# Patient Record
Sex: Male | Born: 1955 | Race: White | Hispanic: No | Marital: Married | Smoking: Former smoker
Health system: Southern US, Community
[De-identification: ages and names within clinical notes are randomized; demographics above are authoritative.]

## PROBLEM LIST (undated history)

## (undated) DIAGNOSIS — G4730 Sleep apnea, unspecified: Secondary | ICD-10-CM

## (undated) DIAGNOSIS — N529 Male erectile dysfunction, unspecified: Secondary | ICD-10-CM

## (undated) DIAGNOSIS — C61 Malignant neoplasm of prostate: Secondary | ICD-10-CM

## (undated) DIAGNOSIS — C801 Malignant (primary) neoplasm, unspecified: Secondary | ICD-10-CM

## (undated) DIAGNOSIS — Z85528 Personal history of other malignant neoplasm of kidney: Secondary | ICD-10-CM

## (undated) DIAGNOSIS — E119 Type 2 diabetes mellitus without complications: Secondary | ICD-10-CM

## (undated) DIAGNOSIS — E785 Hyperlipidemia, unspecified: Secondary | ICD-10-CM

## (undated) HISTORY — PX: BACK SURGERY: SHX140

## (undated) HISTORY — PX: NEPHRECTOMY: SHX65

## (undated) HISTORY — PX: APPENDECTOMY: SHX54

## (undated) HISTORY — PX: TONSILLECTOMY: SUR1361

## (undated) HISTORY — PX: COLONOSCOPY: SHX174

## (undated) HISTORY — PX: PROSTATE SURGERY: SHX751

## (undated) HISTORY — PX: VASECTOMY: SHX75

## (undated) HISTORY — PX: HERNIA REPAIR: SHX51

## (undated) HISTORY — PX: KIDNEY SURGERY: SHX687

## (undated) HISTORY — PX: INNER EAR SURGERY: SHX679

## (undated) HISTORY — PX: ANKLE SURGERY: SHX546

## (undated) HISTORY — PX: EYE SURGERY: SHX253

---

## 2011-01-03 ENCOUNTER — Observation Stay: Payer: Self-pay | Admitting: Internal Medicine

## 2012-02-06 ENCOUNTER — Ambulatory Visit: Payer: Self-pay | Admitting: Gastroenterology

## 2012-08-09 ENCOUNTER — Emergency Department: Payer: Self-pay | Admitting: Emergency Medicine

## 2013-07-18 ENCOUNTER — Ambulatory Visit (INDEPENDENT_AMBULATORY_CARE_PROVIDER_SITE_OTHER): Payer: 59 | Admitting: Podiatry

## 2013-07-18 ENCOUNTER — Encounter: Payer: Self-pay | Admitting: Podiatry

## 2013-07-18 ENCOUNTER — Ambulatory Visit (INDEPENDENT_AMBULATORY_CARE_PROVIDER_SITE_OTHER): Payer: 59

## 2013-07-18 VITALS — BP 148/89 | HR 81 | Resp 16 | Ht 71.0 in | Wt 265.0 lb

## 2013-07-18 DIAGNOSIS — M779 Enthesopathy, unspecified: Secondary | ICD-10-CM

## 2013-07-18 DIAGNOSIS — M79609 Pain in unspecified limb: Secondary | ICD-10-CM

## 2013-07-18 DIAGNOSIS — M79673 Pain in unspecified foot: Secondary | ICD-10-CM

## 2013-07-18 DIAGNOSIS — M7750 Other enthesopathy of unspecified foot: Secondary | ICD-10-CM

## 2013-07-18 DIAGNOSIS — S96919A Strain of unspecified muscle and tendon at ankle and foot level, unspecified foot, initial encounter: Secondary | ICD-10-CM

## 2013-07-18 DIAGNOSIS — M778 Other enthesopathies, not elsewhere classified: Secondary | ICD-10-CM

## 2013-07-18 MED ORDER — METHYLPREDNISOLONE (PAK) 4 MG PO TABS: ORAL_TABLET | ORAL | Status: DC

## 2013-07-18 MED ORDER — MELOXICAM 15 MG PO TABS: 15.0000 mg | ORAL_TABLET | Freq: Every day | ORAL | Status: DC

## 2013-07-18 NOTE — Progress Notes (Signed)
   Subjective:    Patient ID: Gregory Haley, male    DOB: 1956-03-29, 58 y.o.   MRN: [id number]  HPI Comments: Its the right foot on top. Its hurting bad and swollen. Its been like this since last week. i was walking and it popped and started limping. Two days ago it popped again. Its gotten worse. i went to [facility] and they took a x-ray and they said it wasn't broken. They wrapped it up in an ace wrap. i keep it wrapped up and using ointment on it.  Foot Pain      Review of Systems  All other systems reviewed and are negative.       Objective:   Physical Exam: I have reviewed his past history medications allergies surgeries and social history. Review systems unremarkable. Vital signs are stable he is alert and oriented x3. Pulses are strongly palpable bilateral lower extremity neurologic sensorium is intact and hypersensitive to the dorsal aspect of the right foot. Allodenic type symptomatology is present as well. Deep tendon reflexes are intact bilateral muscle strength + over 5 dorsiflexors plantar flexors inverters everters with exception of the dorsiflexors to the right foot. Is exquisitely tender for him to dorsiflex his toes. There's overlying edema to the right foot. None on the left foot. Radiographic evaluation does not demonstrate any osseous abnormalities. Orthopedic evaluation demonstrates all joints distal to the ankle a full range of motion without crepitus however it is exquisitely sore for him to move his toes either passively or actively.  I injected the dorsal aspect of the foot and hoping to numb the dorsal nerves enough that I could palpate evaluate the extensor apparatus. However he still had pain even after local anesthetic had been injected. I was unable to palpate the extensor tendons.          Assessment & Plan:  Assessment: Capsulitis forefoot right. Possible tear of his extensor tendons #2 and #3 of his right foot. Severe pain to the right foot.  Plan: Discussed  the etiology pathology conservative versus surgical therapies. At this point we injected the forefoot with Kenalog and local anesthetic I also put him in a Darco shoe and a compression dressing. We started him on Medrol Dosepak to be followed by Mobic and I will followup with him once his MRI is completed for the forefoot right. He is requesting surgical intervention if at all possible. This is questionable.

## 2013-07-19 ENCOUNTER — Encounter: Payer: Self-pay | Admitting: Podiatry

## 2013-07-30 ENCOUNTER — Ambulatory Visit: Payer: Self-pay | Admitting: Podiatry

## 2013-07-31 ENCOUNTER — Telehealth: Payer: Self-pay | Admitting: *Deleted

## 2013-07-31 DIAGNOSIS — S93609A Unspecified sprain of unspecified foot, initial encounter: Secondary | ICD-10-CM

## 2013-07-31 NOTE — Telephone Encounter (Signed)
Spoke with pt letting him know dr Milinda Pointer reviewed his MRI and that it is a stress fracture. Per dr Milinda Pointer pt needs to come in to pick up cam walker and start wearing it and make appt to see dr Milinda Pointer on 3.23.15. Pt understood.

## 2013-08-05 ENCOUNTER — Encounter: Payer: Self-pay | Admitting: Podiatry

## 2013-08-12 ENCOUNTER — Encounter: Payer: Self-pay | Admitting: Podiatry

## 2013-08-12 ENCOUNTER — Ambulatory Visit (INDEPENDENT_AMBULATORY_CARE_PROVIDER_SITE_OTHER): Payer: 59

## 2013-08-12 ENCOUNTER — Ambulatory Visit (INDEPENDENT_AMBULATORY_CARE_PROVIDER_SITE_OTHER): Payer: 59 | Admitting: Podiatry

## 2013-08-12 VITALS — BP 148/89 | HR 81 | Resp 18

## 2013-08-12 DIAGNOSIS — M84374A Stress fracture, right foot, initial encounter for fracture: Secondary | ICD-10-CM

## 2013-08-12 DIAGNOSIS — M8430XA Stress fracture, unspecified site, initial encounter for fracture: Secondary | ICD-10-CM

## 2013-08-12 NOTE — Progress Notes (Signed)
Gregory Haley presents today for followup of his MRI which did demonstrate a fracture of the second metatarsal of his right foot. He states that he has been better since he is been in the boot.  Objective: Vital signs are stable he is alert and oriented x3. Ulcers are palpable right foot. Radiographs today demonstrate a soft tissue increase in density of the foot and a healing transverse mid diaphyseal fracture of the second metatarsal right foot.  Assessment: Fracture second metatarsal right nondisplaced non-comminuted.  Plan: Continue another 6 weeks of Cam Walker therapy and I will followup with him in 3 weeks for another set of x-rays.

## 2013-09-02 ENCOUNTER — Encounter: Payer: Self-pay | Admitting: Podiatry

## 2013-09-02 ENCOUNTER — Ambulatory Visit (INDEPENDENT_AMBULATORY_CARE_PROVIDER_SITE_OTHER): Payer: 59 | Admitting: Podiatry

## 2013-09-02 ENCOUNTER — Ambulatory Visit (INDEPENDENT_AMBULATORY_CARE_PROVIDER_SITE_OTHER): Payer: 59

## 2013-09-02 VITALS — BP 138/89 | HR 81 | Resp 16

## 2013-09-02 DIAGNOSIS — S92323A Displaced fracture of second metatarsal bone, unspecified foot, initial encounter for closed fracture: Secondary | ICD-10-CM

## 2013-09-02 DIAGNOSIS — S92309A Fracture of unspecified metatarsal bone(s), unspecified foot, initial encounter for closed fracture: Secondary | ICD-10-CM

## 2013-09-02 NOTE — Progress Notes (Signed)
He presents today for followup of his fractured second metatarsal of his right foot. He states it seems to be doing some better and continues wear his a regular basis.  Objective: Vital signs are stable he is alert and oriented x3. There is no overlying erythema no ecchymosis to pain. Radiographic evaluation demonstrates well-healing second metatarsal metadiaphyseal region.  Assessment: Well-healing fracture second metatarsal right foot.  Plan: Discussed etiology pathology conservative or surgical therapies at this point he'll continue to wear the boot until one week prior to his go back to work date. He will then start wearing a tennis shoe to enable his excessive work.

## 2013-09-16 ENCOUNTER — Encounter: Payer: Self-pay | Admitting: *Deleted

## 2013-09-16 ENCOUNTER — Telehealth: Payer: Self-pay | Admitting: *Deleted

## 2013-09-16 NOTE — Telephone Encounter (Signed)
Fine with me

## 2013-09-16 NOTE — Telephone Encounter (Signed)
PT CALLED SAID HE IS SUPPOSED TO BE GOING BACK TO WORK ON 5.4.15. STATES HIS FOOT IS STILL AGGRAVATING AND IS SCARED HE WILL RE-INJURE HIS FOOT. WANTS TO KNOW IF CAN BE OUT OF WORK ADDITIONAL 2 WEEKS. WILL COME BY AND PICK UP PAPERS HE WILL NEED.

## 2013-09-16 NOTE — Telephone Encounter (Signed)
Spoke with pt regarding additional time off of work. Per dr Milinda Pointer pt can stay out additional 2 weeks. Will return to work on mon 5.18.15. Wrote pt out of work letter and pt states he will pick it up tues 4.28.15.

## 2013-09-30 ENCOUNTER — Ambulatory Visit (INDEPENDENT_AMBULATORY_CARE_PROVIDER_SITE_OTHER): Payer: 59

## 2013-09-30 ENCOUNTER — Ambulatory Visit: Payer: 59 | Admitting: Podiatry

## 2013-09-30 ENCOUNTER — Ambulatory Visit (INDEPENDENT_AMBULATORY_CARE_PROVIDER_SITE_OTHER): Payer: 59 | Admitting: Podiatry

## 2013-09-30 VITALS — BP 124/86 | HR 100 | Resp 16

## 2013-09-30 DIAGNOSIS — M79609 Pain in unspecified limb: Secondary | ICD-10-CM

## 2013-09-30 DIAGNOSIS — S92309A Fracture of unspecified metatarsal bone(s), unspecified foot, initial encounter for closed fracture: Secondary | ICD-10-CM

## 2013-09-30 DIAGNOSIS — M79673 Pain in unspecified foot: Secondary | ICD-10-CM

## 2013-09-30 DIAGNOSIS — S92323A Displaced fracture of second metatarsal bone, unspecified foot, initial encounter for closed fracture: Secondary | ICD-10-CM

## 2013-09-30 NOTE — Progress Notes (Signed)
He presents today for followup of his fractured metatarsal of his right foot. He states is feeling much better.  Objective: Vital signs are stable he is alert and oriented x3. Is no pain on palpation second metatarsal of the right foot. Pulses are strongly palpable. Radiographic evaluation demonstrates a well-healed is fracture second metatarsal metadiaphyseal region.  Assessment: Well-healing stress fracture right foot.  Plan: Allow him to get back to regular shoe gear back to work.

## 2015-09-29 ENCOUNTER — Encounter: Payer: Self-pay | Admitting: Emergency Medicine

## 2015-09-29 ENCOUNTER — Emergency Department: Payer: Self-pay

## 2015-09-29 ENCOUNTER — Emergency Department
Admission: EM | Admit: 2015-09-29 | Discharge: 2015-09-29 | Disposition: A | Discharge status: Home / Self Care | Payer: Self-pay | Attending: Emergency Medicine | Admitting: Emergency Medicine

## 2015-09-29 DIAGNOSIS — Z7952 Long term (current) use of systemic steroids: Secondary | ICD-10-CM | POA: Insufficient documentation

## 2015-09-29 DIAGNOSIS — Z791 Long term (current) use of non-steroidal anti-inflammatories (NSAID): Secondary | ICD-10-CM | POA: Insufficient documentation

## 2015-09-29 DIAGNOSIS — G44209 Tension-type headache, unspecified, not intractable: Secondary | ICD-10-CM | POA: Insufficient documentation

## 2015-09-29 MED ORDER — TRAMADOL HCL 50 MG PO TABS
50.0000 mg | ORAL_TABLET | Freq: Once | ORAL | Status: AC
  Administered 2015-09-29: 50 mg via ORAL
  Filled 2015-09-29: qty 1

## 2015-09-29 MED ORDER — KETOROLAC TROMETHAMINE 60 MG/2ML IM SOLN
30.0000 mg | Freq: Once | INTRAMUSCULAR | Status: AC
  Administered 2015-09-29: 30 mg via INTRAMUSCULAR
  Filled 2015-09-29: qty 2

## 2015-09-29 MED ORDER — BUTALBITAL-APAP-CAFFEINE 50-325-40 MG PO TABS: 1.0000 | ORAL_TABLET | Freq: Four times a day (QID) | ORAL | Status: AC | PRN

## 2015-09-29 NOTE — ED Provider Notes (Signed)
[facility] Emergency Department Provider Note   ____________________________________________  Time seen: Approximately 10:21 AM  I have reviewed the triage vital signs and the nursing notes.   HISTORY  Chief Complaint Headache    HPI Gregory Haley is a 60 y.o. male patient state increasing occipital headache for 3 months. Patient states he had 3 days of what he suspect with migraine headaches 3 months ago. Patient states since that episode she's had intermittent headache or syncope increased in intensity. Patient state the headache today was the worst, forced him to come to the emergency room. Patient states in the past his headaches relieved with over-the-counter BC powders.. Patient denies any previous vision changes or chest pain. Patient states flush feeling to his face and blurry vision today. No palliative measures taken for this complaint today. Patient past medical history positive for prostate cancer requiring surgical removal of the prostate 2 years ago. Patient is now rated his pain as a 5/10.Patient describes pain as "sharp".   History reviewed. No pertinent past medical history.  There are no active problems to display for this patient.   Past Surgical History  Procedure Laterality Date  . Ankle surgery Right     x 7  . Appendectomy    . Eye surgery    . Inner ear surgery    . Back surgery    . Hernia repair    . Vasectomy      Current Outpatient Rx  Name  Route  Sig  Dispense  Refill  . butalbital-acetaminophen-caffeine (FIORICET) 50-325-40 MG tablet   Oral   Take 1-2 tablets by mouth every 6 (six) hours as needed for headache.   20 tablet   0   . meloxicam (MOBIC) 15 MG tablet   Oral   Take 1 tablet (15 mg total) by mouth daily.   30 tablet   3   . methylPREDNIsolone (MEDROL DOSPACK) 4 MG tablet      follow package directions   21 tablet   0     Allergies Review of patient's allergies indicates no known  allergies.  History reviewed. No pertinent family history.  Social History Social History  Substance Use Topics  . Smoking status: Never Smoker   . Smokeless tobacco: None  . Alcohol Use: Yes     Comment: weekly    Review of Systems Constitutional: No fever/chills Eyes: No visual changes. ENT: No sore throat. Cardiovascular: Denies chest pain. Respiratory: Denies shortness of breath. Gastrointestinal: No abdominal pain.  No nausea, no vomiting.  No diarrhea.  No constipation. Genitourinary: Negative for dysuria. Musculoskeletal: Negative for back pain. Skin: Negative for rash. Neurological: Positive for headaches, but denies focal weakness or numbness. 10-point ROS otherwise negative.  ____________________________________________   PHYSICAL EXAM:  VITAL SIGNS: ED Triage Vitals  Enc Vitals Group     BP 09/29/15 1014 111/80 mmHg     Pulse Rate 09/29/15 1014 80     Resp 09/29/15 1014 20     Temp 09/29/15 1014 98.2 F (36.8 C)     Temp Source 09/29/15 1014 Oral     SpO2 09/29/15 1014 98 %     Weight 09/29/15 1014 240 lb (108.863 kg)     Height 09/29/15 1014 6' (1.829 m)     Head Cir --      Peak Flow --      Pain Score 09/29/15 1014 5     Pain Loc --      Pain  Edu? --      Excl. in [place]? --     Constitutional: Alert and oriented. Well appearing and in no acute distress. Eyes: Conjunctivae are normal. PERRL. EOMI. Head: Atraumatic. Nose: No congestion/rhinnorhea. Mouth/Throat: Mucous membranes are moist.  Oropharynx non-erythematous. Neck: No stridor.  No cervical spine tenderness to palpation. Hematological/Lymphatic/Immunilogical: No cervical lymphadenopathy. Cardiovascular: Normal rate, regular rhythm. Grossly normal heart sounds.  Good peripheral circulation. Respiratory: Normal respiratory effort.  No retractions. Lungs CTAB. Gastrointestinal: Soft and nontender. No distention. No abdominal bruits. No CVA tenderness. Musculoskeletal: No lower extremity  tenderness nor edema.  No joint effusions. Neurologic:  Normal speech and language. No gross focal neurologic deficits are appreciated. No gait instability. Skin:  Skin is warm, dry and intact. No rash noted. Psychiatric: Mood and affect are normal. Speech and behavior are normal.  ____________________________________________   LABS (all labs ordered are listed, but only abnormal results are displayed)  Labs Reviewed - No data to display ____________________________________________  EKG   ____________________________________________  RADIOLOGY   ___No acute findings on CT scan of the head. _________________________________________   PROCEDURES  Procedure(s) performed: None  Critical Care performed: No  ____________________________________________   INITIAL IMPRESSION / ASSESSMENT AND PLAN / ED COURSE  Pertinent labs & imaging results that were available during my care of the patient were reviewed by me and considered in my medical decision making (see chart for details).  Tension headache. Patient given a prescription for Esgic-Plus. [facility] ____________________________________________   FINAL CLINICAL IMPRESSION(S) / ED DIAGNOSES  Final diagnoses:  Tension headache      NEW MEDICATIONS STARTED DURING THIS VISIT:  New Prescriptions   BUTALBITAL-ACETAMINOPHEN-CAFFEINE (FIORICET) 50-325-40 MG TABLET    Take 1-2 tablets by mouth every 6 (six) hours as needed for headache.     Note:  This document was prepared using Dragon voice recognition software and may include unintentional dictation errors.    Sable Feil, PA-C 09/29/15 [place], MD 09/29/15 [phone]

## 2015-09-29 NOTE — ED Notes (Addendum)
Pt to ed with c/o headache x 3 months intermittently.  Pt states he has used bc powder with some relief.  Denies blurred vision, denies chest pain.

## 2017-12-02 DIAGNOSIS — Z79899 Other long term (current) drug therapy: Secondary | ICD-10-CM | POA: Diagnosis not present

## 2017-12-02 DIAGNOSIS — J029 Acute pharyngitis, unspecified: Secondary | ICD-10-CM | POA: Insufficient documentation

## 2017-12-02 LAB — GROUP A STREP BY PCR: Group A Strep by PCR: NOT DETECTED

## 2017-12-02 NOTE — ED Triage Notes (Signed)
Patient c/o sore throat and right ear pain X 2 days.

## 2017-12-03 ENCOUNTER — Emergency Department
Admission: EM | Admit: 2017-12-03 | Discharge: 2017-12-03 | Disposition: A | Discharge status: Home / Self Care | Payer: BLUE CROSS/BLUE SHIELD | Attending: Emergency Medicine | Admitting: Emergency Medicine

## 2017-12-03 DIAGNOSIS — J029 Acute pharyngitis, unspecified: Secondary | ICD-10-CM

## 2017-12-03 MED ORDER — HYDROCODONE-ACETAMINOPHEN 7.5-325 MG/15ML PO SOLN
10.0000 mL | Freq: Once | ORAL | Status: AC
  Administered 2017-12-03: 10 mL via ORAL
  Filled 2017-12-03: qty 15

## 2017-12-03 MED ORDER — HYDROCODONE-ACETAMINOPHEN 7.5-325 MG/15ML PO SOLN: 10.0000 mL | Freq: Four times a day (QID) | ORAL | 0 refills | Status: AC | PRN

## 2017-12-03 MED ORDER — AMOXICILLIN 500 MG PO CAPS
500.0000 mg | ORAL_CAPSULE | Freq: Once | ORAL | Status: AC
  Administered 2017-12-03: 500 mg via ORAL
  Filled 2017-12-03: qty 1

## 2017-12-03 MED ORDER — AMOXICILLIN 500 MG PO CAPS: 500.0000 mg | ORAL_CAPSULE | Freq: Three times a day (TID) | ORAL | 0 refills | Status: DC

## 2017-12-03 MED ORDER — DEXAMETHASONE SODIUM PHOSPHATE 10 MG/ML IJ SOLN
10.0000 mg | Freq: Once | INTRAMUSCULAR | Status: AC
  Administered 2017-12-03: 10 mg via INTRAMUSCULAR
  Filled 2017-12-03: qty 1

## 2017-12-03 NOTE — Discharge Instructions (Addendum)
1.  Take antibiotic as prescribed (amoxicillin 500 mg 3 times daily for 7 days. 2.  You may take Lortab elixir as needed for pain. 3.  Return to the ER for worsening symptoms, persistent vomiting, difficulty breathing or other concerns.

## 2017-12-03 NOTE — ED Notes (Signed)
Pt waiting patiently for treatment room; watching videos on cell phone

## 2017-12-03 NOTE — ED Provider Notes (Signed)
[facility] Emergency Department Provider Note   ____________________________________________   First MD Initiated Contact with Patient 12/03/17 [phone]     (approximate)  I have reviewed the triage vital signs and the nursing notes.   HISTORY  Chief Complaint Sore Throat and Otalgia    HPI Gregory Haley is a 62 y.o. male who presents to the ED from home with a chief complaint of sore throat and right ear pain.  Symptoms x2 days.  No sick contacts.  Denies associated fever, chills, chest pain, shortness of breath, abdominal pain, nausea, vomiting, rash.  Denies recent travel or trauma.   Past medical history None  There are no active problems to display for this patient.   Past Surgical History:  Procedure Laterality Date  . ANKLE SURGERY Right    x 7  . APPENDECTOMY    . BACK SURGERY    . EYE SURGERY    . HERNIA REPAIR    . INNER EAR SURGERY    . VASECTOMY    Tonsillectomy  Prior to Admission medications   Medication Sig Start Date End Date Taking? Authorizing Provider  amoxicillin (AMOXIL) 500 MG capsule Take 1 capsule (500 mg total) by mouth 3 (three) times daily. 12/03/17   Paulette Blanch, MD  HYDROcodone-acetaminophen (HYCET) 7.5-325 mg/15 ml solution Take 10 mLs by mouth every 6 (six) hours as needed for moderate pain. 12/03/17 12/03/18  Paulette Blanch, MD  meloxicam (MOBIC) 15 MG tablet Take 1 tablet (15 mg total) by mouth daily. 07/18/13   Hyatt, Max T, DPM  methylPREDNIsolone (MEDROL DOSPACK) 4 MG tablet follow package directions 07/18/13   Tyson Dense T, [place]    Allergies Patient has no known allergies.  No family history on file.  Social History Social History   Tobacco Use  . Smoking status: Never Smoker  . Smokeless tobacco: Never Used  Substance Use Topics  . Alcohol use: Yes    Comment: weekly  . Drug use: No    Review of Systems  Constitutional: No fever/chills Eyes: No visual changes. ENT: Positive for sore  throat. Cardiovascular: Denies chest pain. Respiratory: Denies shortness of breath. Gastrointestinal: No abdominal pain.  No nausea, no vomiting.  No diarrhea.  No constipation. Genitourinary: Negative for dysuria. Musculoskeletal: Negative for back pain. Skin: Negative for rash. Neurological: Negative for headaches, focal weakness or numbness.   ____________________________________________   PHYSICAL EXAM:  VITAL SIGNS: ED Triage Vitals  Enc Vitals Group     BP 12/02/17 2318 129/61     Pulse Rate 12/02/17 2318 78     Resp 12/02/17 2318 18     Temp 12/02/17 2318 98.2 F (36.8 C)     Temp Source 12/02/17 2318 Oral     SpO2 12/02/17 2318 96 %     Weight 12/02/17 2317 240 lb (108.9 kg)     Height 12/02/17 2317 5\' 11"  (1.803 m)     Head Circumference --      Peak Flow --      Pain Score 12/02/17 2317 7     Pain Loc --      Pain Edu? --      Excl. in [place]? --     Constitutional: Alert and oriented. Well appearing and in no acute distress. Eyes: Conjunctivae are normal. PERRL. EOMI. Head: Atraumatic. Ears: Mild fluid behind right TM which is otherwise unremarkable.  Left TM within normal limits. Nose: No congestion/rhinnorhea. Mouth/Throat: Mucous membranes are moist.  Oropharynx  mildly erythematous without tonsillar swelling or peritonsillar abscess.  Exudates noted.  There is no hoarse or muffled voice.  There is no drooling. Neck: No stridor.  Supple neck without meningismus. Hematological/Lymphatic/Immunilogical:Shotty anterior cervical lymphadenopathy. Cardiovascular: Normal rate, regular rhythm. Grossly normal heart sounds.  Good peripheral circulation. Respiratory: Normal respiratory effort.  No retractions. Lungs CTAB. Gastrointestinal: Soft and nontender. No distention. No abdominal bruits. No CVA tenderness. Musculoskeletal: No lower extremity tenderness nor edema.  No joint effusions. Neurologic:  Normal speech and language. No gross focal neurologic deficits are  appreciated. No gait instability. Skin:  Skin is warm, dry and intact. No rash noted.  No petechiae. Psychiatric: Mood and affect are normal. Speech and behavior are normal.  ____________________________________________   LABS (all labs ordered are listed, but only abnormal results are displayed)  Labs Reviewed  GROUP A STREP BY PCR   ____________________________________________  EKG  None ____________________________________________  RADIOLOGY  ED MD interpretation: None  Official radiology report(s): No results found.  ____________________________________________   PROCEDURES  Procedure(s) performed: None  Procedures  Critical Care performed: No  ____________________________________________   INITIAL IMPRESSION / ASSESSMENT AND PLAN / ED COURSE  As part of my medical decision making, I reviewed the following data within the [place] notes reviewed and incorporated, Labs reviewed, Old chart reviewed and Notes from prior ED visits   62 year old male who presents with sore throat.  Rapid strep is negative.  Exudates noted on exam.  Will administer 10 mg IM Decadron, Lortab elixir for pain, start amoxicillin and patient will follow-up with his PCP next week.  Strict return precautions given.  Patient verbalizes understanding and agrees with plan of care.      ____________________________________________   FINAL CLINICAL IMPRESSION(S) / ED DIAGNOSES  Final diagnoses:  Sore throat  Pharyngitis, unspecified etiology     ED Discharge Orders        Ordered    amoxicillin (AMOXIL) 500 MG capsule  3 times daily     12/03/17 0533    HYDROcodone-acetaminophen (HYCET) 7.5-325 mg/15 ml solution  Every 6 hours PRN     12/03/17 0533       Note:  This document was prepared using Dragon voice recognition software and may include unintentional dictation errors.    Paulette Blanch, MD 12/03/17 [phone]

## 2018-01-12 ENCOUNTER — Other Ambulatory Visit: Payer: Self-pay

## 2018-01-12 ENCOUNTER — Encounter (INDEPENDENT_AMBULATORY_CARE_PROVIDER_SITE_OTHER): Payer: Self-pay

## 2018-01-12 ENCOUNTER — Encounter: Payer: Self-pay | Admitting: *Deleted

## 2018-01-12 ENCOUNTER — Ambulatory Visit
Admission: RE | Admit: 2018-01-12 | Discharge: 2018-01-12 | Disposition: A | Discharge status: Home / Self Care | Payer: BLUE CROSS/BLUE SHIELD | Source: Ambulatory Visit | Attending: Radiation Oncology | Admitting: Radiation Oncology

## 2018-01-12 DIAGNOSIS — Z79899 Other long term (current) drug therapy: Secondary | ICD-10-CM | POA: Insufficient documentation

## 2018-01-12 DIAGNOSIS — C61 Malignant neoplasm of prostate: Secondary | ICD-10-CM | POA: Diagnosis present

## 2018-01-12 NOTE — Consult Note (Signed)
NEW PATIENT EVALUATION  Name: Gregory Haley  MRN: [id number]  Date:   01/12/2018     DOB: 10/26/1955   This 62 y.o. 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  REFERRING PHYSICIAN: Ricardo Jericho*  CHIEF COMPLAINT:  Chief Complaint  Patient presents with  . Prostate Cancer    initial eval    DIAGNOSIS: The encounter diagnosis was Malignant neoplasm of prostate ([place]).   PREVIOUS INVESTIGATIONS:  Pathology reports reviewed Clinical notes reviewed CT scan and bone scan reports reviewed  HPI: patient is a 62 year old male status post radical retropubic prostatectomy July 2015 for a T3a Gleason 6 (3+3) adenocarcinoma the prostate with positive margins. Patient's PSA has never normalized. His continued to climb and the patient has been recommended for salvage radiation therapy although declined based on lack of insurance over the years. His most recent PSA on record was 0.69. Hehad a bone scan as well as CT scan back in 2015 showing no evidence of metastatic disease. His PSA has continued to climb most recently was 2.9. He has very little symptoms no specific urinary incontinence. He is now referred to radiation oncology for consideration of salvage treatment. He's having no bone pain.  PLANNED TREATMENT REGIMEN: salvage radiation therapy as well as a DT therapy  PAST MEDICAL HISTORY:  has no past medical history on file.    PAST SURGICAL HISTORY:  Past Surgical History:  Procedure Laterality Date  . ANKLE SURGERY Right    x 7  . APPENDECTOMY    . BACK SURGERY    . EYE SURGERY    . HERNIA REPAIR    . INNER EAR SURGERY    . VASECTOMY      FAMILY HISTORY: family history is not on file.  SOCIAL HISTORY:  reports that he has never smoked. He has never used smokeless tobacco.  He reports that he drinks alcohol. He reports that he does not use drugs.  ALLERGIES: Patient has no known allergies.  MEDICATIONS:  Current Outpatient Medications  Medication Sig Dispense Refill  . amoxicillin (AMOXIL) 500 MG capsule Take 1 capsule (500 mg total) by mouth 3 (three) times daily. 21 capsule 0  . HYDROcodone-acetaminophen (HYCET) 7.5-325 mg/15 ml solution Take 10 mLs by mouth every 6 (six) hours as needed for moderate pain. 120 mL 0  . meloxicam (MOBIC) 15 MG tablet Take 1 tablet (15 mg total) by mouth daily. 30 tablet 3  . methylPREDNIsolone (MEDROL DOSPACK) 4 MG tablet follow package directions 21 tablet 0   No current facility-administered medications for this encounter.     ECOG PERFORMANCE STATUS:  0 - Asymptomatic  REVIEW OF SYSTEMS:  Patient denies any weight loss, fatigue, weakness, fever, chills or night sweats. Patient denies any loss of vision, blurred vision. Patient denies any ringing  of the ears or hearing loss. No irregular heartbeat. Patient denies heart murmur or history of fainting. Patient denies any chest pain or pain radiating to her upper extremities. Patient denies any shortness of breath, difficulty breathing at night, cough or hemoptysis. Patient denies any swelling in the lower legs. Patient denies any nausea vomiting, vomiting of blood, or coffee ground material in the vomitus. Patient denies any stomach pain. Patient states has had normal bowel movements no significant constipation or diarrhea. Patient denies any dysuria, hematuria or significant  nocturia. Patient denies any problems walking, swelling in the joints or loss of balance. Patient denies any skin changes, loss of hair or loss of weight. Patient denies any excessive worrying or anxiety or significant depression. Patient denies any problems with insomnia. Patient denies excessive thirst, polyuria, polydipsia. Patient denies any swollen glands, patient denies easy bruising or easy bleeding.  Patient denies any recent infections, allergies or URI. Patient "s visual fields have not changed significantly in recent time.    PHYSICAL EXAM: BP (!) (P) 150/100 (BP Location: Left Arm, Patient Position: Sitting)   Pulse (P) 80   Temp (P) 98.6 F (37 C) (Tympanic)   Wt (P) 275 lb 7.4 oz (125 kg)   BMI (P) 38.42 kg/m  On rectal exam rectal sphincter tone is good prostatic fossa is clear without evidence of nodularity or mass.Well-developed well-nourished patient in NAD. HEENT reveals PERLA, EOMI, discs not visualized.  Oral cavity is clear. No oral mucosal lesions are identified. Neck is clear without evidence of cervical or supraclavicular adenopathy. Lungs are clear to A&P. Cardiac examination is essentially unremarkable with regular rate and rhythm without murmur rub or thrill. Abdomen is benign with no organomegaly or masses noted. Motor sensory and DTR levels are equal and symmetric in the upper and lower extremities. Cranial nerves II through XII are grossly intact. Proprioception is intact. No peripheral adenopathy or edema is identified. No motor or sensory levels are noted. Crude visual fields are within normal range.  LABORATORY DATA: pathology reports reviewed    RADIOLOGY RESULTS:bone scan reports and CT scan reports reviewed   IMPRESSION: stage III locally advanced adenocarcinoma prostate status post prostatectomy with positive margins in rising PSA over the past 56 years in14 year old male  PLAN: t this time I have recommended salvage radiation therapy to his prostatic fossa and pelvic nodes. With the long abdomen of time I believe his pelvic lymph nodes were risk for metastatic disease and would treat his prostatic fossa the 7600 cGy using I MRT radiation therapy treatment planning and delivery. Also deliver 5400 cGy to his pelvic nodes using I MRT dose painting technique. I will also start androgen deprivation therapy and have requested Lupron injection for the patient.Lupron  along with salvage radiation therapy has been shown to increase overall survival and delay time to recurrence. Risks and benefits of treatment including increased lower urinary tract symptoms diigue alteration of blood counts skin reaction all were din detail with the patient. He seems to comprehend my treatment plan well. I have ordered Lupron injection as well as personally ordered CT simulation for next week.  I would like to take this opportunity to thank you for allowing me to participate in the care of your patient.Noreene Filbert, MD

## 2018-01-18 ENCOUNTER — Inpatient Hospital Stay: Payer: BLUE CROSS/BLUE SHIELD | Attending: Radiation Oncology

## 2018-01-18 ENCOUNTER — Ambulatory Visit: Payer: BLUE CROSS/BLUE SHIELD

## 2018-01-30 ENCOUNTER — Ambulatory Visit: Payer: BLUE CROSS/BLUE SHIELD

## 2018-01-31 ENCOUNTER — Ambulatory Visit: Payer: BLUE CROSS/BLUE SHIELD

## 2018-02-01 ENCOUNTER — Ambulatory Visit: Payer: BLUE CROSS/BLUE SHIELD

## 2018-02-02 ENCOUNTER — Ambulatory Visit: Payer: BLUE CROSS/BLUE SHIELD

## 2018-02-05 ENCOUNTER — Ambulatory Visit: Payer: BLUE CROSS/BLUE SHIELD

## 2018-02-06 ENCOUNTER — Ambulatory Visit: Payer: BLUE CROSS/BLUE SHIELD

## 2018-02-07 ENCOUNTER — Ambulatory Visit: Payer: BLUE CROSS/BLUE SHIELD

## 2018-02-08 ENCOUNTER — Ambulatory Visit: Payer: BLUE CROSS/BLUE SHIELD

## 2018-02-09 ENCOUNTER — Ambulatory Visit: Payer: BLUE CROSS/BLUE SHIELD

## 2018-02-12 ENCOUNTER — Ambulatory Visit: Payer: BLUE CROSS/BLUE SHIELD

## 2018-02-13 ENCOUNTER — Ambulatory Visit: Payer: BLUE CROSS/BLUE SHIELD

## 2018-02-14 ENCOUNTER — Ambulatory Visit: Payer: BLUE CROSS/BLUE SHIELD

## 2018-02-15 ENCOUNTER — Ambulatory Visit: Payer: BLUE CROSS/BLUE SHIELD

## 2018-02-16 ENCOUNTER — Ambulatory Visit: Payer: BLUE CROSS/BLUE SHIELD

## 2018-02-19 ENCOUNTER — Ambulatory Visit: Payer: BLUE CROSS/BLUE SHIELD

## 2018-02-20 ENCOUNTER — Ambulatory Visit: Payer: BLUE CROSS/BLUE SHIELD

## 2018-02-21 ENCOUNTER — Ambulatory Visit: Payer: BLUE CROSS/BLUE SHIELD

## 2018-02-22 ENCOUNTER — Ambulatory Visit: Payer: BLUE CROSS/BLUE SHIELD

## 2018-02-23 ENCOUNTER — Ambulatory Visit: Payer: BLUE CROSS/BLUE SHIELD

## 2018-02-26 ENCOUNTER — Ambulatory Visit: Payer: BLUE CROSS/BLUE SHIELD

## 2018-02-27 ENCOUNTER — Ambulatory Visit: Payer: BLUE CROSS/BLUE SHIELD

## 2018-02-28 ENCOUNTER — Ambulatory Visit: Payer: BLUE CROSS/BLUE SHIELD

## 2018-03-01 ENCOUNTER — Ambulatory Visit: Payer: BLUE CROSS/BLUE SHIELD

## 2018-03-02 ENCOUNTER — Ambulatory Visit: Payer: BLUE CROSS/BLUE SHIELD

## 2018-03-05 ENCOUNTER — Ambulatory Visit: Payer: BLUE CROSS/BLUE SHIELD

## 2018-03-06 ENCOUNTER — Ambulatory Visit: Payer: BLUE CROSS/BLUE SHIELD

## 2018-03-07 ENCOUNTER — Ambulatory Visit: Payer: BLUE CROSS/BLUE SHIELD

## 2018-03-08 ENCOUNTER — Ambulatory Visit: Payer: BLUE CROSS/BLUE SHIELD

## 2018-03-09 ENCOUNTER — Ambulatory Visit: Payer: BLUE CROSS/BLUE SHIELD

## 2018-03-12 ENCOUNTER — Ambulatory Visit: Payer: BLUE CROSS/BLUE SHIELD

## 2018-03-13 ENCOUNTER — Ambulatory Visit: Payer: BLUE CROSS/BLUE SHIELD

## 2018-03-14 ENCOUNTER — Ambulatory Visit: Payer: BLUE CROSS/BLUE SHIELD

## 2018-03-15 ENCOUNTER — Ambulatory Visit: Payer: BLUE CROSS/BLUE SHIELD

## 2018-03-16 ENCOUNTER — Ambulatory Visit: Payer: BLUE CROSS/BLUE SHIELD

## 2018-03-19 ENCOUNTER — Ambulatory Visit: Payer: BLUE CROSS/BLUE SHIELD

## 2018-03-20 ENCOUNTER — Ambulatory Visit: Payer: BLUE CROSS/BLUE SHIELD

## 2018-03-21 ENCOUNTER — Ambulatory Visit: Payer: BLUE CROSS/BLUE SHIELD

## 2018-03-22 ENCOUNTER — Ambulatory Visit: Payer: BLUE CROSS/BLUE SHIELD

## 2018-03-23 ENCOUNTER — Ambulatory Visit: Payer: BLUE CROSS/BLUE SHIELD

## 2018-03-28 DIAGNOSIS — C61 Malignant neoplasm of prostate: Secondary | ICD-10-CM | POA: Diagnosis not present

## 2018-03-29 DIAGNOSIS — C61 Malignant neoplasm of prostate: Secondary | ICD-10-CM | POA: Diagnosis not present

## 2018-03-30 DIAGNOSIS — C61 Malignant neoplasm of prostate: Secondary | ICD-10-CM | POA: Diagnosis not present

## 2018-04-02 DIAGNOSIS — C61 Malignant neoplasm of prostate: Secondary | ICD-10-CM | POA: Diagnosis not present

## 2018-04-03 DIAGNOSIS — C61 Malignant neoplasm of prostate: Secondary | ICD-10-CM | POA: Diagnosis not present

## 2018-04-04 DIAGNOSIS — C61 Malignant neoplasm of prostate: Secondary | ICD-10-CM | POA: Diagnosis not present

## 2018-04-05 DIAGNOSIS — C61 Malignant neoplasm of prostate: Secondary | ICD-10-CM | POA: Diagnosis not present

## 2018-04-06 DIAGNOSIS — N5203 Combined arterial insufficiency and corporo-venous occlusive erectile dysfunction: Secondary | ICD-10-CM | POA: Diagnosis not present

## 2018-04-06 DIAGNOSIS — E669 Obesity, unspecified: Secondary | ICD-10-CM | POA: Diagnosis not present

## 2018-04-06 DIAGNOSIS — C61 Malignant neoplasm of prostate: Secondary | ICD-10-CM | POA: Diagnosis not present

## 2018-04-06 DIAGNOSIS — R972 Elevated prostate specific antigen [PSA]: Secondary | ICD-10-CM | POA: Diagnosis not present

## 2018-04-09 DIAGNOSIS — C61 Malignant neoplasm of prostate: Secondary | ICD-10-CM | POA: Diagnosis not present

## 2018-04-10 DIAGNOSIS — C61 Malignant neoplasm of prostate: Secondary | ICD-10-CM | POA: Diagnosis not present

## 2018-04-11 DIAGNOSIS — C61 Malignant neoplasm of prostate: Secondary | ICD-10-CM | POA: Diagnosis not present

## 2018-04-12 DIAGNOSIS — C61 Malignant neoplasm of prostate: Secondary | ICD-10-CM | POA: Diagnosis not present

## 2018-04-13 DIAGNOSIS — C61 Malignant neoplasm of prostate: Secondary | ICD-10-CM | POA: Diagnosis not present

## 2018-04-16 DIAGNOSIS — C61 Malignant neoplasm of prostate: Secondary | ICD-10-CM | POA: Diagnosis not present

## 2018-04-17 DIAGNOSIS — C61 Malignant neoplasm of prostate: Secondary | ICD-10-CM | POA: Diagnosis not present

## 2018-04-18 DIAGNOSIS — C61 Malignant neoplasm of prostate: Secondary | ICD-10-CM | POA: Diagnosis not present

## 2018-04-23 DIAGNOSIS — C61 Malignant neoplasm of prostate: Secondary | ICD-10-CM | POA: Diagnosis not present

## 2018-04-24 DIAGNOSIS — C61 Malignant neoplasm of prostate: Secondary | ICD-10-CM | POA: Diagnosis not present

## 2018-04-25 DIAGNOSIS — C61 Malignant neoplasm of prostate: Secondary | ICD-10-CM | POA: Diagnosis not present

## 2018-04-26 DIAGNOSIS — C61 Malignant neoplasm of prostate: Secondary | ICD-10-CM | POA: Diagnosis not present

## 2018-04-27 DIAGNOSIS — C61 Malignant neoplasm of prostate: Secondary | ICD-10-CM | POA: Diagnosis not present

## 2018-04-30 DIAGNOSIS — C61 Malignant neoplasm of prostate: Secondary | ICD-10-CM | POA: Diagnosis not present

## 2018-05-01 DIAGNOSIS — C61 Malignant neoplasm of prostate: Secondary | ICD-10-CM | POA: Diagnosis not present

## 2018-05-02 DIAGNOSIS — C61 Malignant neoplasm of prostate: Secondary | ICD-10-CM | POA: Diagnosis not present

## 2018-05-03 DIAGNOSIS — C61 Malignant neoplasm of prostate: Secondary | ICD-10-CM | POA: Diagnosis not present

## 2018-05-04 DIAGNOSIS — C61 Malignant neoplasm of prostate: Secondary | ICD-10-CM | POA: Diagnosis not present

## 2018-05-07 DIAGNOSIS — C61 Malignant neoplasm of prostate: Secondary | ICD-10-CM | POA: Diagnosis not present

## 2018-05-08 DIAGNOSIS — C61 Malignant neoplasm of prostate: Secondary | ICD-10-CM | POA: Diagnosis not present

## 2018-05-09 DIAGNOSIS — C61 Malignant neoplasm of prostate: Secondary | ICD-10-CM | POA: Diagnosis not present

## 2018-06-07 DIAGNOSIS — H9313 Tinnitus, bilateral: Secondary | ICD-10-CM | POA: Diagnosis not present

## 2018-06-07 DIAGNOSIS — H9193 Unspecified hearing loss, bilateral: Secondary | ICD-10-CM | POA: Diagnosis not present

## 2018-06-07 DIAGNOSIS — Q828 Other specified congenital malformations of skin: Secondary | ICD-10-CM | POA: Diagnosis not present

## 2018-06-07 DIAGNOSIS — G4733 Obstructive sleep apnea (adult) (pediatric): Secondary | ICD-10-CM | POA: Diagnosis not present

## 2018-07-04 DIAGNOSIS — H5213 Myopia, bilateral: Secondary | ICD-10-CM | POA: Diagnosis not present

## 2018-07-05 DIAGNOSIS — G4733 Obstructive sleep apnea (adult) (pediatric): Secondary | ICD-10-CM | POA: Diagnosis not present

## 2018-07-26 DIAGNOSIS — G4733 Obstructive sleep apnea (adult) (pediatric): Secondary | ICD-10-CM | POA: Diagnosis not present

## 2018-08-22 DIAGNOSIS — G4733 Obstructive sleep apnea (adult) (pediatric): Secondary | ICD-10-CM | POA: Diagnosis not present

## 2018-09-21 DIAGNOSIS — G4733 Obstructive sleep apnea (adult) (pediatric): Secondary | ICD-10-CM | POA: Diagnosis not present

## 2018-10-12 DIAGNOSIS — L858 Other specified epidermal thickening: Secondary | ICD-10-CM | POA: Diagnosis not present

## 2018-10-22 DIAGNOSIS — G4733 Obstructive sleep apnea (adult) (pediatric): Secondary | ICD-10-CM | POA: Diagnosis not present

## 2018-11-20 DIAGNOSIS — N5203 Combined arterial insufficiency and corporo-venous occlusive erectile dysfunction: Secondary | ICD-10-CM | POA: Diagnosis not present

## 2018-11-20 DIAGNOSIS — C61 Malignant neoplasm of prostate: Secondary | ICD-10-CM | POA: Diagnosis not present

## 2018-11-20 DIAGNOSIS — E669 Obesity, unspecified: Secondary | ICD-10-CM | POA: Diagnosis not present

## 2018-11-20 DIAGNOSIS — N2889 Other specified disorders of kidney and ureter: Secondary | ICD-10-CM | POA: Diagnosis not present

## 2018-11-25 DIAGNOSIS — G4733 Obstructive sleep apnea (adult) (pediatric): Secondary | ICD-10-CM | POA: Diagnosis not present

## 2018-12-06 DIAGNOSIS — N2889 Other specified disorders of kidney and ureter: Secondary | ICD-10-CM | POA: Diagnosis not present

## 2018-12-10 DIAGNOSIS — N2889 Other specified disorders of kidney and ureter: Secondary | ICD-10-CM | POA: Diagnosis not present

## 2018-12-22 DIAGNOSIS — G4733 Obstructive sleep apnea (adult) (pediatric): Secondary | ICD-10-CM | POA: Diagnosis not present

## 2018-12-26 DIAGNOSIS — G4733 Obstructive sleep apnea (adult) (pediatric): Secondary | ICD-10-CM | POA: Diagnosis not present

## 2019-01-16 DIAGNOSIS — N289 Disorder of kidney and ureter, unspecified: Secondary | ICD-10-CM | POA: Diagnosis not present

## 2019-01-22 DIAGNOSIS — G4733 Obstructive sleep apnea (adult) (pediatric): Secondary | ICD-10-CM | POA: Diagnosis not present

## 2019-01-23 DIAGNOSIS — C641 Malignant neoplasm of right kidney, except renal pelvis: Secondary | ICD-10-CM | POA: Diagnosis not present

## 2019-01-23 DIAGNOSIS — Z9221 Personal history of antineoplastic chemotherapy: Secondary | ICD-10-CM | POA: Diagnosis not present

## 2019-01-23 DIAGNOSIS — Z9079 Acquired absence of other genital organ(s): Secondary | ICD-10-CM | POA: Diagnosis not present

## 2019-01-26 DIAGNOSIS — G4733 Obstructive sleep apnea (adult) (pediatric): Secondary | ICD-10-CM | POA: Diagnosis not present

## 2019-02-14 DIAGNOSIS — G4733 Obstructive sleep apnea (adult) (pediatric): Secondary | ICD-10-CM | POA: Diagnosis not present

## 2019-02-21 DIAGNOSIS — G4733 Obstructive sleep apnea (adult) (pediatric): Secondary | ICD-10-CM | POA: Diagnosis not present

## 2019-02-25 DIAGNOSIS — G4733 Obstructive sleep apnea (adult) (pediatric): Secondary | ICD-10-CM | POA: Diagnosis not present

## 2019-03-24 DIAGNOSIS — G4733 Obstructive sleep apnea (adult) (pediatric): Secondary | ICD-10-CM | POA: Diagnosis not present

## 2019-03-28 DIAGNOSIS — G4733 Obstructive sleep apnea (adult) (pediatric): Secondary | ICD-10-CM | POA: Diagnosis not present

## 2019-04-23 DIAGNOSIS — G4733 Obstructive sleep apnea (adult) (pediatric): Secondary | ICD-10-CM | POA: Diagnosis not present

## 2019-04-27 DIAGNOSIS — G4733 Obstructive sleep apnea (adult) (pediatric): Secondary | ICD-10-CM | POA: Diagnosis not present

## 2019-05-10 DIAGNOSIS — H9201 Otalgia, right ear: Secondary | ICD-10-CM | POA: Diagnosis not present

## 2019-05-24 DIAGNOSIS — G4733 Obstructive sleep apnea (adult) (pediatric): Secondary | ICD-10-CM | POA: Diagnosis not present

## 2019-05-28 DIAGNOSIS — G4733 Obstructive sleep apnea (adult) (pediatric): Secondary | ICD-10-CM | POA: Diagnosis not present

## 2019-06-06 DIAGNOSIS — C61 Malignant neoplasm of prostate: Secondary | ICD-10-CM | POA: Diagnosis not present

## 2019-06-06 DIAGNOSIS — R972 Elevated prostate specific antigen [PSA]: Secondary | ICD-10-CM | POA: Diagnosis not present

## 2019-06-06 DIAGNOSIS — E669 Obesity, unspecified: Secondary | ICD-10-CM | POA: Diagnosis not present

## 2019-06-06 DIAGNOSIS — N5203 Combined arterial insufficiency and corporo-venous occlusive erectile dysfunction: Secondary | ICD-10-CM | POA: Diagnosis not present

## 2019-06-24 DIAGNOSIS — G4733 Obstructive sleep apnea (adult) (pediatric): Secondary | ICD-10-CM | POA: Diagnosis not present

## 2019-06-28 DIAGNOSIS — G4733 Obstructive sleep apnea (adult) (pediatric): Secondary | ICD-10-CM | POA: Diagnosis not present

## 2019-07-22 DIAGNOSIS — G4733 Obstructive sleep apnea (adult) (pediatric): Secondary | ICD-10-CM | POA: Diagnosis not present

## 2019-07-26 DIAGNOSIS — G4733 Obstructive sleep apnea (adult) (pediatric): Secondary | ICD-10-CM | POA: Diagnosis not present

## 2019-08-22 DIAGNOSIS — G4733 Obstructive sleep apnea (adult) (pediatric): Secondary | ICD-10-CM | POA: Diagnosis not present

## 2019-08-26 DIAGNOSIS — G4733 Obstructive sleep apnea (adult) (pediatric): Secondary | ICD-10-CM | POA: Diagnosis not present

## 2019-09-09 DIAGNOSIS — G4733 Obstructive sleep apnea (adult) (pediatric): Secondary | ICD-10-CM | POA: Diagnosis not present

## 2019-09-13 DIAGNOSIS — C641 Malignant neoplasm of right kidney, except renal pelvis: Secondary | ICD-10-CM | POA: Diagnosis not present

## 2019-09-13 DIAGNOSIS — Z08 Encounter for follow-up examination after completed treatment for malignant neoplasm: Secondary | ICD-10-CM | POA: Diagnosis not present

## 2019-09-13 DIAGNOSIS — Z905 Acquired absence of kidney: Secondary | ICD-10-CM | POA: Diagnosis not present

## 2019-09-13 DIAGNOSIS — C649 Malignant neoplasm of unspecified kidney, except renal pelvis: Secondary | ICD-10-CM | POA: Diagnosis not present

## 2019-09-21 DIAGNOSIS — G4733 Obstructive sleep apnea (adult) (pediatric): Secondary | ICD-10-CM | POA: Diagnosis not present

## 2019-10-21 ENCOUNTER — Emergency Department
Admission: EM | Admit: 2019-10-21 | Discharge: 2019-10-21 | Disposition: A | Discharge status: Home / Self Care | Payer: 59 | Attending: Emergency Medicine | Admitting: Emergency Medicine

## 2019-10-21 ENCOUNTER — Other Ambulatory Visit: Payer: Self-pay

## 2019-10-21 ENCOUNTER — Emergency Department: Payer: 59

## 2019-10-21 DIAGNOSIS — K5900 Constipation, unspecified: Secondary | ICD-10-CM

## 2019-10-21 DIAGNOSIS — R109 Unspecified abdominal pain: Secondary | ICD-10-CM | POA: Diagnosis not present

## 2019-10-21 LAB — COMPREHENSIVE METABOLIC PANEL
ALT: 33 U/L (ref 0–44)
AST: 24 U/L (ref 15–41)
Albumin: 4.1 g/dL (ref 3.5–5.0)
Alkaline Phosphatase: 66 U/L (ref 38–126)
Anion gap: 10 (ref 5–15)
BUN: 18 mg/dL (ref 8–23)
CO2: 25 mmol/L (ref 22–32)
Calcium: 9.1 mg/dL (ref 8.9–10.3)
Chloride: 104 mmol/L (ref 98–111)
Creatinine, Ser: 1.07 mg/dL (ref 0.61–1.24)
GFR calc Af Amer: >60 mL/min (ref >60)
GFR calc non Af Amer: >60 mL/min (ref >60)
Glucose, Bld: 185 mg/dL — ABNORMAL HIGH (ref 70–99)
Potassium: 3.8 mmol/L (ref 3.5–5.1)
Sodium: 139 mmol/L (ref 135–145)
Total Bilirubin: 0.8 mg/dL (ref 0.3–1.2)
Total Protein: 7.2 g/dL (ref 6.5–8.1)

## 2019-10-21 LAB — URINALYSIS, COMPLETE (UACMP) WITH MICROSCOPIC
Bacteria, UA: NONE SEEN
Bilirubin Urine: NEGATIVE
Glucose, UA: NEGATIVE mg/dL
Hgb urine dipstick: NEGATIVE
Ketones, ur: NEGATIVE mg/dL
Leukocytes,Ua: NEGATIVE
Nitrite: NEGATIVE
Protein, ur: NEGATIVE mg/dL
Specific Gravity, Urine: 1.027 (ref 1.005–1.030)
pH: 5 (ref 5.0–8.0)

## 2019-10-21 LAB — CBC
HCT: 41.8 % (ref 39.0–52.0)
Hemoglobin: 14.6 g/dL (ref 13.0–17.0)
MCH: 27.9 pg (ref 26.0–34.0)
MCHC: 34.9 g/dL (ref 30.0–36.0)
MCV: 79.9 fL — ABNORMAL LOW (ref 80.0–100.0)
Platelets: 234 10*3/uL (ref 150–400)
RBC: 5.23 MIL/uL (ref 4.22–5.81)
RDW: 14.2 % (ref 11.5–15.5)
WBC: 7.1 10*3/uL (ref 4.0–10.5)
nRBC: 0 % (ref 0.0–0.2)

## 2019-10-21 LAB — LIPASE, BLOOD: Lipase: 87 U/L — ABNORMAL HIGH (ref 11–51)

## 2019-10-21 MED ORDER — SODIUM CHLORIDE 0.9% FLUSH: 3.0000 mL | Freq: Once | INTRAVENOUS | Status: DC

## 2019-10-21 MED ORDER — POLYETHYLENE GLYCOL 3350 17 GM/SCOOP PO POWD: 1.0000 | Freq: Once | ORAL | 0 refills | Status: AC

## 2019-10-21 MED ORDER — POLYETHYLENE GLYCOL 3350 17 GM/SCOOP PO POWD: 1.0000 | Freq: Once | ORAL | 0 refills | Status: DC

## 2019-10-21 MED ORDER — IOHEXOL 300 MG/ML  SOLN
125.0000 mL | Freq: Once | INTRAMUSCULAR | Status: AC | PRN
  Administered 2019-10-21: 125 mL via INTRAVENOUS

## 2019-10-21 NOTE — ED Provider Notes (Signed)
[facility] Provider Note       Time seen: 7:10 PM    I have reviewed the vital signs and the nursing notes.  HISTORY   Chief Complaint Abdominal Pain   HPI Gregory Haley is a 64 y.o. male with a history of abdominal surgery who presents today for right-sided abdominal pain for the past 3 months.  Denies any nausea, vomiting or diarrhea.  Patient states is only getting worse.  Has had a previous right-sided nephrectomy, appendectomy, hernia repair.  He denies fevers, chills or other complaints.  History reviewed. No pertinent past medical history.  Past Surgical History:  Procedure Laterality Date  . ANKLE SURGERY Right    x 7  . APPENDECTOMY    . BACK SURGERY    . EYE SURGERY    . HERNIA REPAIR    . INNER EAR SURGERY    . VASECTOMY      Allergies Patient has no known allergies.  Review of Systems Constitutional: Negative for fever. Cardiovascular: Negative for chest pain. Respiratory: Negative for shortness of breath. Gastrointestinal: Positive for abdominal pain Musculoskeletal: Negative for back pain. Skin: Negative for rash. Neurological: Negative for headaches, focal weakness or numbness.  All systems negative/normal/unremarkable except as stated in the HPI  ____________________________________________   PHYSICAL EXAM:  VITAL SIGNS: Vitals:   10/21/19 1453  BP: 139/88  Pulse: 88  Resp: 18  Temp: 98 F (36.7 C)  SpO2: 94%    Constitutional: Alert and oriented. Well appearing and in no distress. Eyes: Conjunctivae are normal. Normal extraocular movements. ENT      Head: Normocephalic and atraumatic.      Nose: No congestion/rhinnorhea.      Mouth/Throat: Mucous membranes are moist.      Neck: No stridor. Cardiovascular: Normal rate, regular rhythm. No murmurs, rubs, or gallops. Respiratory: Normal respiratory effort without tachypnea nor retractions. Breath sounds are clear and equal bilaterally. No  wheezes/rales/rhonchi. Gastrointestinal right flank and right mid quadrant tenderness, no rebound or guarding.  Normal bowel sounds. Musculoskeletal: Nontender with normal range of motion in extremities. No lower extremity tenderness nor edema. Neurologic:  Normal speech and language. No gross focal neurologic deficits are appreciated.  Skin:  Skin is warm, dry and intact. No rash noted. Psychiatric: Speech and behavior are normal.  ____________________________________________   LABS (pertinent positives/negatives)  Labs Reviewed  LIPASE, BLOOD - Abnormal; Notable for the following components:      Result Value   Lipase 87 (*)    All other components within normal limits  COMPREHENSIVE METABOLIC PANEL - Abnormal; Notable for the following components:   Glucose, Bld 185 (*)    All other components within normal limits  CBC - Abnormal; Notable for the following components:   MCV 79.9 (*)    All other components within normal limits  URINALYSIS, COMPLETE (UACMP) WITH MICROSCOPIC - Abnormal; Notable for the following components:   Color, Urine AMBER (*)    APPearance CLOUDY (*)    All other components within normal limits   RADIOLOGY  Images were viewed by me CT the abdomen pelvis with contrast IMPRESSION: 1. No acute findings in the abdomen/pelvis. 2. Moderate volume of stool throughout the colon, can be seen with constipation. No bowel obstruction or inflammation. 3. Mild hepatomegaly and hepatic steatosis. Hepatic cysts. 4. Prostatectomy. 5. Indeterminate 12 mm left adrenal nodule. Recommend correlation prior imaging. 6. Mild sigmoid colonic diverticulosis without diverticulitis.  Aortic Atherosclerosis (ICD10-I70.0).   DIFFERENTIAL DIAGNOSIS  Renal colic, UTI, pyelonephritis, occult CVA, biliary colic, cholecystitis  ASSESSMENT AND PLAN  Flank pain   Plan: The patient had presented for persistent right flank pain. Patient's labs did not reveal any acute process.   The only abnormal finding on CT was constipation.  Lipase is slightly elevated but looks normal on CT.  I will refer to GI for close outpatient follow-up.  Lenise Arena MD    Note: This note was generated in part or whole with voice recognition software. Voice recognition is usually quite accurate but there are transcription errors that can and very often do occur. I apologize for any typographical errors that were not detected and corrected.     Earleen Newport, MD 10/21/19 1946

## 2019-10-21 NOTE — ED Notes (Signed)
E-signature not working at this time. Pt verbalized understanding of D/C instructions, prescriptions and follow up care with no further questions at this time. Pt in NAD and ambulatory at time of D/C.  

## 2019-10-21 NOTE — ED Triage Notes (Signed)
Pt c/o mid right sided abd pain for the past 3 months, denies any N/V/D.Marland Kitchen sates it is just getting worse.

## 2019-11-11 DIAGNOSIS — C61 Malignant neoplasm of prostate: Secondary | ICD-10-CM | POA: Diagnosis not present

## 2020-01-13 DIAGNOSIS — G4733 Obstructive sleep apnea (adult) (pediatric): Secondary | ICD-10-CM | POA: Diagnosis not present

## 2020-02-10 ENCOUNTER — Ambulatory Visit: Payer: 59 | Admitting: Internal Medicine

## 2020-02-13 DIAGNOSIS — G4733 Obstructive sleep apnea (adult) (pediatric): Secondary | ICD-10-CM | POA: Diagnosis not present

## 2020-03-08 ENCOUNTER — Emergency Department
Admission: EM | Admit: 2020-03-08 | Discharge: 2020-03-08 | Disposition: A | Discharge status: Home / Self Care | Payer: 59 | Attending: Emergency Medicine | Admitting: Emergency Medicine

## 2020-03-08 ENCOUNTER — Other Ambulatory Visit: Payer: Self-pay

## 2020-03-08 ENCOUNTER — Emergency Department: Payer: 59

## 2020-03-08 ENCOUNTER — Encounter: Payer: Self-pay | Admitting: Emergency Medicine

## 2020-03-08 DIAGNOSIS — S8991XA Unspecified injury of right lower leg, initial encounter: Secondary | ICD-10-CM | POA: Diagnosis present

## 2020-03-08 DIAGNOSIS — Z8546 Personal history of malignant neoplasm of prostate: Secondary | ICD-10-CM | POA: Diagnosis not present

## 2020-03-08 DIAGNOSIS — S82224A Nondisplaced transverse fracture of shaft of right tibia, initial encounter for closed fracture: Secondary | ICD-10-CM | POA: Diagnosis not present

## 2020-03-08 DIAGNOSIS — W1781XA Fall down embankment (hill), initial encounter: Secondary | ICD-10-CM | POA: Insufficient documentation

## 2020-03-08 DIAGNOSIS — Z87891 Personal history of nicotine dependence: Secondary | ICD-10-CM | POA: Diagnosis not present

## 2020-03-08 DIAGNOSIS — S82831A Other fracture of upper and lower end of right fibula, initial encounter for closed fracture: Secondary | ICD-10-CM | POA: Diagnosis not present

## 2020-03-08 HISTORY — DX: Malignant (primary) neoplasm, unspecified: C80.1

## 2020-03-08 MED ORDER — OXYCODONE-ACETAMINOPHEN 5-325 MG PO TABS: 1.0000 | ORAL_TABLET | ORAL | 0 refills | Status: DC | PRN

## 2020-03-08 MED ORDER — MORPHINE SULFATE (PF) 4 MG/ML IV SOLN
4.0000 mg | Freq: Once | INTRAVENOUS | Status: AC
  Administered 2020-03-08: 4 mg via INTRAMUSCULAR
  Filled 2020-03-08: qty 1

## 2020-03-08 NOTE — ED Notes (Signed)
Knee immobilizer placed, cap refill <2 seconds, +pulses. Pt has hx of ambulating with axillary crutches, reminded not to put weight in axilla and to put weight in hands when walking. Pt demonstrated ambulation to RN and passed. AO x4

## 2020-03-08 NOTE — ED Provider Notes (Signed)
[facility] Emergency Department Provider Note  ____________________________________________   First MD Initiated Contact with Patient 03/08/20 0402     (approximate)  I have reviewed the triage vital signs and the nursing notes.   HISTORY  Chief Complaint Knee Pain    HPI Gregory Haley is a 64 y.o. male with below list of previous medical conditions presents to the emergency department following an accidental fall at 11 AM yesterday morning with resultant right leg pain.  Patient states that current pain score is 10 out of 10.  Pain is worsened with any movement of the right lower extremity and attempted ambulation.        Past Medical History:  Diagnosis Date  . Cancer [facility])    prostate    There are no problems to display for this patient.   Past Surgical History:  Procedure Laterality Date  . ANKLE SURGERY Right    x 7  . APPENDECTOMY    . BACK SURGERY    . EYE SURGERY    . HERNIA REPAIR    . INNER EAR SURGERY    . KIDNEY SURGERY    . PROSTATE SURGERY    . VASECTOMY      Prior to Admission medications   Medication Sig Start Date End Date Taking? Authorizing Provider  amoxicillin (AMOXIL) 500 MG capsule Take 1 capsule (500 mg total) by mouth 3 (three) times daily. 12/03/17   Paulette Blanch, MD  meloxicam (MOBIC) 15 MG tablet Take 1 tablet (15 mg total) by mouth daily. 07/18/13   Hyatt, Max T, DPM  methylPREDNIsolone (MEDROL DOSPACK) 4 MG tablet follow package directions 07/18/13   Downs, [place] T, DPM    Allergies Bee venom and Microplegia msa-msg [plegisol]  No family history on file.  Social History Social History   Tobacco Use  . Smoking status: Former Research scientist (life sciences)  . Smokeless tobacco: Never Used  Substance Use Topics  . Alcohol use: Yes    Comment: occ  . Drug use: No    Review of Systems Constitutional: No fever/chills Eyes: No visual changes. ENT: No sore throat. Cardiovascular: Denies chest pain. Respiratory: Denies  shortness of breath. Gastrointestinal: No abdominal pain.  No nausea, no vomiting.  No diarrhea.  No constipation. Genitourinary: Negative for dysuria. Musculoskeletal: Positive for right leg pain negative for neck pain.  Negative for back pain. Integumentary: Negative for rash. Neurological: Negative for headaches, focal weakness or numbness.   ____________________________________________   PHYSICAL EXAM:  VITAL SIGNS: ED Triage Vitals [03/08/20 0134]  Enc Vitals Group     BP (!) 154/96     Pulse Rate 90     Resp 18     Temp 98.3 F (36.8 C)     Temp Source Oral     SpO2 95 %     Weight 127 kg (280 lb)     Height 1.829 m (6')     Head Circumference      Peak Flow      Pain Score 7     Pain Loc      Pain Edu?      Excl. in [place]?     Constitutional: Alert and oriented.  Apparent discomfort Eyes: Conjunctivae are normal.  Head: Atraumatic. Mouth/Throat: Patient is wearing a mask. Neck: No stridor.   Cardiovascular: Normal rate, regular rhythm. Good peripheral circulation. Grossly normal heart sounds. Respiratory: Normal respiratory effort.  No retractions. Gastrointestinal: Soft and nontender. No distention.  Musculoskeletal: Right lower  extremity pain lateral portion of the lower leg as well as right medial malleoli.  Swelling also noted. Neurologic:  Normal speech and language. No gross focal neurologic deficits are appreciated.  Skin:  Skin is warm, dry and intact. Psychiatric: Mood and affect are normal. Speech and behavior are normal.    RADIOLOGY I, [place], personally viewed and evaluated these images (plain radiographs) as part of my medical decision making, as well as reviewing the written report by the radiologist.  ED MD interpretation: Nondisplaced fracture of the fibular neck on x-ray per radiologist  Official radiology report(s): DG Knee Complete 4 Views Right  Result Date: 03/08/2020 CLINICAL DATA:  64 year old male with right knee pain.  EXAM: RIGHT KNEE - COMPLETE 4+ VIEW COMPARISON:  None. FINDINGS: Nondisplaced fracture of the fibular neck. No other acute fracture identified. The bones are mildly osteopenic. No dislocation. No significant arthritic changes. There is a small suprapatellar effusion. The soft tissues are unremarkable. IMPRESSION: Nondisplaced fracture of the fibular neck. Electronically Signed   By: Anner Crete M.D.   On: 03/08/2020 02:21    _______________________________________  Procedures   ____________________________________________   INITIAL IMPRESSION / MDM / [place] / ED COURSE  As part of my medical decision making, I reviewed the following data within the electronic MEDICAL RECORD NUMBER   64 year old male presented with above-stated history and physical exam following accidental fall with resultant right leg pain.  Concern for possible fracture versus dislocation versus sprain.  X-ray reveal nondisplaced fracture of the right fibular neck.  Knee immobilizer applied to the patient crutches given.  Patient requested a shot of Demerol for pain.  Patient was given morphine 4 mg IM with pain improvement.  Patient prescribed Percocet for home and advised not to drive or perform any activity that would result in possible bodily harm if he were to become somnolent as this is a side effect of Percocet.  ____________________________________________  FINAL CLINICAL IMPRESSION(S) / ED DIAGNOSES  Final diagnoses:  Other closed fracture of proximal end of right fibula, initial encounter     MEDICATIONS GIVEN DURING THIS VISIT:  Medications  morphine 4 MG/ML injection 4 mg (has no administration in time range)     ED Discharge Orders    None      *Please note:  Gregory Haley was evaluated in Emergency Department on 03/08/2020 for the symptoms described in the history of present illness. He was evaluated in the context of the global COVID-19 pandemic, which necessitated consideration  that the patient might be at risk for infection with the SARS-CoV-2 virus that causes COVID-19. Institutional protocols and algorithms that pertain to the evaluation of patients at risk for COVID-19 are in a state of rapid change based on information released by regulatory bodies including the CDC and federal and state organizations. These policies and algorithms were followed during the patient's care in the ED.  Some ED evaluations and interventions may be delayed as a result of limited staffing during and after the pandemic.*  Note:  This document was prepared using Dragon voice recognition software and may include unintentional dictation errors.   Gregor Hams, MD 03/08/20 2234

## 2020-03-08 NOTE — ED Triage Notes (Addendum)
Patient states that he slipped and fell down a hill today. Patient states that he felt his knee pop. Patient states that he has pain behind his right knee.

## 2020-03-09 DIAGNOSIS — S89201A Unspecified physeal fracture of upper end of right fibula, initial encounter for closed fracture: Secondary | ICD-10-CM | POA: Diagnosis not present

## 2020-03-25 DIAGNOSIS — S89201A Unspecified physeal fracture of upper end of right fibula, initial encounter for closed fracture: Secondary | ICD-10-CM | POA: Diagnosis not present

## 2020-04-09 NOTE — Progress Notes (Signed)
Patient was no-show for appointment.  The office staff will contact the patient for rescheduling follow-up. 

## 2020-06-09 DIAGNOSIS — Z905 Acquired absence of kidney: Secondary | ICD-10-CM | POA: Diagnosis not present

## 2020-06-09 DIAGNOSIS — R109 Unspecified abdominal pain: Secondary | ICD-10-CM | POA: Diagnosis not present

## 2020-08-24 ENCOUNTER — Other Ambulatory Visit: Payer: Self-pay

## 2020-08-24 ENCOUNTER — Emergency Department: Payer: 59

## 2020-08-24 ENCOUNTER — Emergency Department
Admission: EM | Admit: 2020-08-24 | Discharge: 2020-08-24 | Disposition: A | Discharge status: Home / Self Care | Payer: 59 | Attending: Emergency Medicine | Admitting: Emergency Medicine

## 2020-08-24 DIAGNOSIS — R5383 Other fatigue: Secondary | ICD-10-CM | POA: Diagnosis not present

## 2020-08-24 DIAGNOSIS — R531 Weakness: Secondary | ICD-10-CM | POA: Diagnosis not present

## 2020-08-24 DIAGNOSIS — R519 Headache, unspecified: Secondary | ICD-10-CM | POA: Diagnosis not present

## 2020-08-24 DIAGNOSIS — Z8546 Personal history of malignant neoplasm of prostate: Secondary | ICD-10-CM | POA: Insufficient documentation

## 2020-08-24 DIAGNOSIS — R0789 Other chest pain: Secondary | ICD-10-CM | POA: Insufficient documentation

## 2020-08-24 DIAGNOSIS — Z87891 Personal history of nicotine dependence: Secondary | ICD-10-CM | POA: Insufficient documentation

## 2020-08-24 LAB — BASIC METABOLIC PANEL
Anion gap: 7 (ref 5–15)
BUN: 18 mg/dL (ref 8–23)
CO2: 24 mmol/L (ref 22–32)
Calcium: 9.1 mg/dL (ref 8.9–10.3)
Chloride: 106 mmol/L (ref 98–111)
Creatinine, Ser: 0.95 mg/dL (ref 0.61–1.24)
GFR, Estimated: >60 mL/min (ref >60)
Glucose, Bld: 166 mg/dL — ABNORMAL HIGH (ref 70–99)
Potassium: 4.3 mmol/L (ref 3.5–5.1)
Sodium: 137 mmol/L (ref 135–145)

## 2020-08-24 LAB — CBC
HCT: 43.5 % (ref 39.0–52.0)
Hemoglobin: 14.7 g/dL (ref 13.0–17.0)
MCH: 27.6 pg (ref 26.0–34.0)
MCHC: 33.8 g/dL (ref 30.0–36.0)
MCV: 81.8 fL (ref 80.0–100.0)
Platelets: 238 10*3/uL (ref 150–400)
RBC: 5.32 MIL/uL (ref 4.22–5.81)
RDW: 13.7 % (ref 11.5–15.5)
WBC: 5.5 10*3/uL (ref 4.0–10.5)
nRBC: 0 % (ref 0.0–0.2)

## 2020-08-24 LAB — TROPONIN I (HIGH SENSITIVITY): Troponin I (High Sensitivity): 4 ng/L (ref <18)

## 2020-08-24 MED ORDER — BUTALBITAL-APAP-CAFFEINE 50-325-40 MG PO TABS: 1.0000 | ORAL_TABLET | Freq: Four times a day (QID) | ORAL | 0 refills | Status: AC | PRN

## 2020-08-24 NOTE — ED Triage Notes (Signed)
Pt comes with c/o dizziness and headache that started 3 months ago. Pt states 3 days ago he had some CP. Pt states left sided CP that radiated down his left arm.  Pt states his BP has been elevated as well.

## 2020-08-24 NOTE — ED Provider Notes (Signed)
[facility] Emergency Department Provider Note   ____________________________________________    I have reviewed the triage vital signs and the nursing notes.   HISTORY  Chief Complaint Multiple complaints    HPI [name] is a 65 y.o. male with a history as below who presents with multiple complaints.  Patient reports that several days ago he had chest discomfort which lasted a couple of seconds, he has not had it again since then.  No exertional chest discomfort.  No shortness of breath.  He reports he has been under a lot of stress at work and has been working significant overtime.  Additionally he notes that he has been having fairly frequent headaches which is bothering him.  Denies fevers chills or neck pain.  No neuro deficits.  No trauma.  Past Medical History:  Diagnosis Date  . Cancer [facility])    prostate    There are no problems to display for this patient.   Past Surgical History:  Procedure Laterality Date  . ANKLE SURGERY Right    x 7  . APPENDECTOMY    . BACK SURGERY    . EYE SURGERY    . HERNIA REPAIR    . INNER EAR SURGERY    . KIDNEY SURGERY    . PROSTATE SURGERY    . VASECTOMY      Prior to Admission medications   Medication Sig Start Date End Date Taking? Authorizing Provider  butalbital-acetaminophen-caffeine (FIORICET) 50-325-40 MG tablet Take 1-2 tablets by mouth every 6 (six) hours as needed for headache. 08/24/20 08/24/21 Yes Lavonia Drafts, MD  amoxicillin (AMOXIL) 500 MG capsule Take 1 capsule (500 mg total) by mouth 3 (three) times daily. 12/03/17   Paulette Blanch, MD  meloxicam (MOBIC) 15 MG tablet Take 1 tablet (15 mg total) by mouth daily. 07/18/13   Hyatt, Max T, DPM  methylPREDNIsolone (MEDROL DOSPACK) 4 MG tablet follow package directions 07/18/13   Hyatt, Max T, DPM  oxyCODONE-acetaminophen (PERCOCET) 5-325 MG tablet Take 1 tablet by mouth every 4 (four) hours as needed. 03/08/20 03/08/21  Gregor Hams, MD      Allergies Bee venom and Microplegia msa-msg [plegisol]  No family history on file.  Social History Social History   Tobacco Use  . Smoking status: Former Research scientist (life sciences)  . Smokeless tobacco: Never Used  Substance Use Topics  . Alcohol use: Yes    Comment: occ  . Drug use: No    Review of Systems  Constitutional: No fever/chills Eyes: No visual changes.  ENT: No sore throat. Cardiovascular: As above Respiratory: Denies shortness of breath. Gastrointestinal: No abdominal pain.  No nausea, no vomiting.   Genitourinary: Negative for dysuria. Musculoskeletal: Negative for back pain. Skin: Negative for rash. Neurological:  as above   ____________________________________________   PHYSICAL EXAM:  VITAL SIGNS: ED Triage Vitals  Enc Vitals Group     BP 08/24/20 1051 (!) 147/90     Pulse Rate 08/24/20 1051 77     Resp 08/24/20 1051 18     Temp 08/24/20 1051 97.8 F (36.6 C)     Temp Source 08/24/20 1051 Oral     SpO2 08/24/20 1051 96 %     Weight 08/24/20 1058 134.7 kg (297 lb)     Height 08/24/20 1058 1.803 m (5\' 11" )     Head Circumference --      Peak Flow --      Pain Score 08/24/20 1058 7  Pain Loc --      Pain Edu? --      Excl. in [place]? --     Constitutional: Alert and oriented. No acute distress  Nose: No congestion/rhinnorhea. Mouth/Throat: Mucous membranes are moist.   Neck:  Painless ROM Cardiovascular: Normal rate, regular rhythm. Kermit Balo peripheral circulation. Respiratory: Normal respiratory effort.  No retractions. Lungs CTAB. Gastrointestinal: Soft and nontender. No distention.  No CVA tenderness.  Musculoskeletal:  Warm and well perfused Neurologic:  Normal speech and language. No gross focal neurologic deficits are appreciated.  Cranial nerves II through XII are normal Skin:  Skin is warm, dry and intact. No rash noted. Psychiatric: Mood and affect are normal. Speech and behavior are normal.  ____________________________________________    LABS (all labs ordered are listed, but only abnormal results are displayed)  Labs Reviewed  BASIC METABOLIC PANEL - Abnormal; Notable for the following components:      Result Value   Glucose, Bld 166 (*)    All other components within normal limits  CBC  TROPONIN I (HIGH SENSITIVITY)   ____________________________________________  EKG  ED ECG REPORT I, Lavonia Drafts, the attending physician, personally viewed and interpreted this ECG.  Date: 08/24/2020  Rhythm: normal sinus rhythm QRS Axis: normal Intervals: normal ST/T Wave abnormalities: Nonspecific changes Narrative Interpretation: no evidence of acute ischemia  ____________________________________________  RADIOLOGY  Chest x-ray reviewed by me CT head unremarkable ____________________________________________   PROCEDURES  Procedure(s) performed: No  Procedures   Critical Care performed: No ____________________________________________   INITIAL IMPRESSION / ASSESSMENT AND PLAN / ED COURSE  Pertinent labs & imaging results that were available during my care of the patient were reviewed by me and considered in my medical decision making (see chart for details).  Patient with multiple complaints as described above, additionally does complain of some fatigue which is new over the last 3 to 4 days.  No evidence of infectious etiology, no electrolyte abnormalities, CT head is normal, normal neuro exam  High sensitive troponin is reassuring, chest x-ray and EKG are reassuring.  No indication for admission today, patient reassured by results, emphasized need for outpatient follow-up, strict return precautions discussed    ____________________________________________   FINAL CLINICAL IMPRESSION(S) / ED DIAGNOSES  Final diagnoses:  Atypical chest pain  Acute nonintractable headache, unspecified headache type  Weakness        Note:  This document was prepared using Dragon voice recognition  software and may include unintentional dictation errors.   Lavonia Drafts, MD 08/24/20 [phone]

## 2020-08-28 DIAGNOSIS — Z85528 Personal history of other malignant neoplasm of kidney: Secondary | ICD-10-CM | POA: Diagnosis not present

## 2020-08-28 DIAGNOSIS — E119 Type 2 diabetes mellitus without complications: Secondary | ICD-10-CM | POA: Diagnosis not present

## 2020-08-28 DIAGNOSIS — R5383 Other fatigue: Secondary | ICD-10-CM | POA: Diagnosis not present

## 2020-08-28 DIAGNOSIS — R519 Headache, unspecified: Secondary | ICD-10-CM | POA: Diagnosis not present

## 2020-08-28 DIAGNOSIS — R7301 Impaired fasting glucose: Secondary | ICD-10-CM | POA: Diagnosis not present

## 2020-09-23 DIAGNOSIS — C649 Malignant neoplasm of unspecified kidney, except renal pelvis: Secondary | ICD-10-CM | POA: Diagnosis not present

## 2020-10-28 ENCOUNTER — Other Ambulatory Visit: Payer: Self-pay

## 2020-10-28 ENCOUNTER — Encounter: Payer: Self-pay | Admitting: Emergency Medicine

## 2020-10-28 DIAGNOSIS — R112 Nausea with vomiting, unspecified: Secondary | ICD-10-CM | POA: Insufficient documentation

## 2020-10-28 DIAGNOSIS — Z8546 Personal history of malignant neoplasm of prostate: Secondary | ICD-10-CM | POA: Diagnosis not present

## 2020-10-28 DIAGNOSIS — R197 Diarrhea, unspecified: Secondary | ICD-10-CM | POA: Insufficient documentation

## 2020-10-28 DIAGNOSIS — R1084 Generalized abdominal pain: Secondary | ICD-10-CM | POA: Insufficient documentation

## 2020-10-28 DIAGNOSIS — Z87891 Personal history of nicotine dependence: Secondary | ICD-10-CM | POA: Insufficient documentation

## 2020-10-28 MED ORDER — ONDANSETRON 4 MG PO TBDP
4.0000 mg | ORAL_TABLET | Freq: Once | ORAL | Status: AC | PRN
  Administered 2020-10-28: 4 mg via ORAL
  Filled 2020-10-28 (×2): qty 1

## 2020-10-28 NOTE — ED Triage Notes (Addendum)
Pt arrived via POV with reports of generalized abdominal pain and vomiting since 2pm today.  Pt states he ate [place] today and states the sxs began 2 hours later.

## 2020-10-28 NOTE — ED Notes (Signed)
Pt unable to void at this time. 

## 2020-10-29 ENCOUNTER — Emergency Department
Admission: EM | Admit: 2020-10-29 | Discharge: 2020-10-29 | Disposition: A | Discharge status: Home / Self Care | Payer: 59 | Attending: Emergency Medicine | Admitting: Emergency Medicine

## 2020-10-29 DIAGNOSIS — R197 Diarrhea, unspecified: Secondary | ICD-10-CM

## 2020-10-29 DIAGNOSIS — R112 Nausea with vomiting, unspecified: Secondary | ICD-10-CM | POA: Diagnosis not present

## 2020-10-29 LAB — TROPONIN I (HIGH SENSITIVITY)
Troponin I (High Sensitivity): 3 ng/L (ref <18)
Troponin I (High Sensitivity): <2 ng/L (ref <18)

## 2020-10-29 LAB — LIPASE, BLOOD: Lipase: 39 U/L (ref 11–51)

## 2020-10-29 LAB — COMPREHENSIVE METABOLIC PANEL
ALT: 37 U/L (ref 0–44)
AST: 27 U/L (ref 15–41)
Albumin: 4.8 g/dL (ref 3.5–5.0)
Alkaline Phosphatase: 56 U/L (ref 38–126)
Anion gap: 10 (ref 5–15)
BUN: 23 mg/dL (ref 8–23)
CO2: 20 mmol/L — ABNORMAL LOW (ref 22–32)
Calcium: 9.2 mg/dL (ref 8.9–10.3)
Chloride: 107 mmol/L (ref 98–111)
Creatinine, Ser: 1.21 mg/dL (ref 0.61–1.24)
GFR, Estimated: >60 mL/min (ref >60)
Glucose, Bld: 176 mg/dL — ABNORMAL HIGH (ref 70–99)
Potassium: 4.6 mmol/L (ref 3.5–5.1)
Sodium: 137 mmol/L (ref 135–145)
Total Bilirubin: 1.1 mg/dL (ref 0.3–1.2)
Total Protein: 7.6 g/dL (ref 6.5–8.1)

## 2020-10-29 LAB — CBC
HCT: 49.1 % (ref 39.0–52.0)
Hemoglobin: 16.7 g/dL (ref 13.0–17.0)
MCH: 27.8 pg (ref 26.0–34.0)
MCHC: 34 g/dL (ref 30.0–36.0)
MCV: 81.8 fL (ref 80.0–100.0)
Platelets: 280 10*3/uL (ref 150–400)
RBC: 6 MIL/uL — ABNORMAL HIGH (ref 4.22–5.81)
RDW: 13.9 % (ref 11.5–15.5)
WBC: 11.6 10*3/uL — ABNORMAL HIGH (ref 4.0–10.5)
nRBC: 0 % (ref 0.0–0.2)

## 2020-10-29 MED ORDER — ONDANSETRON 4 MG PO TBDP: 4.0000 mg | ORAL_TABLET | Freq: Three times a day (TID) | ORAL | 0 refills | Status: DC | PRN

## 2020-10-29 MED ORDER — FAMOTIDINE IN NACL 20-0.9 MG/50ML-% IV SOLN
20.0000 mg | Freq: Once | INTRAVENOUS | Status: AC
  Administered 2020-10-29: 20 mg via INTRAVENOUS
  Filled 2020-10-29: qty 50

## 2020-10-29 MED ORDER — SODIUM CHLORIDE 0.9 % IV BOLUS
1000.0000 mL | Freq: Once | INTRAVENOUS | Status: AC
  Administered 2020-10-29: 1000 mL via INTRAVENOUS

## 2020-10-29 MED ORDER — KETOROLAC TROMETHAMINE 30 MG/ML IJ SOLN
15.0000 mg | Freq: Once | INTRAMUSCULAR | Status: AC
  Administered 2020-10-29: 15 mg via INTRAVENOUS
  Filled 2020-10-29: qty 1

## 2020-10-29 NOTE — ED Provider Notes (Signed)
[facility] Emergency Department Provider Note  ____________________________________________  Time seen: Approximately 3:49 AM  I have reviewed the triage vital signs and the nursing notes.   HISTORY  Chief Complaint Abdominal Pain and Emesis   HPI Gregory Haley is a 65 y.o. male with a history of remote pancreatic cancer, diabetes who presents for evaluation of generalized abdominal cramping, vomiting and diarrhea.  Patient reports that his symptoms started 2 hours after he went to [facility] for dinner.  He reports having a salad, baked potato and steak.  His family was with him but they did not eat the same thing he did.  No one else is sick in the home.  He is complaining of diffuse crampy abdominal pain.  Several episodes of nonbloody nonbilious emesis and watery diarrhea.  No melena, hematemesis, coffee-ground emesis, localized abdominal pain, fever, chills, dysuria, hematuria, chest pain or shortness of breath.  Past Medical History:  Diagnosis Date   Cancer [facility])    prostate    There are no problems to display for this patient.   Past Surgical History:  Procedure Laterality Date   ANKLE SURGERY Right    x 7   APPENDECTOMY     BACK SURGERY     EYE SURGERY     HERNIA REPAIR     INNER EAR SURGERY     KIDNEY SURGERY     PROSTATE SURGERY     VASECTOMY      Prior to Admission medications   Medication Sig Start Date End Date Taking? Authorizing Provider  ondansetron (ZOFRAN ODT) 4 MG disintegrating tablet Take 1 tablet (4 mg total) by mouth every 8 (eight) hours as needed. 10/29/20  Yes Alfred Levins, [place], MD  amoxicillin (AMOXIL) 500 MG capsule Take 1 capsule (500 mg total) by mouth 3 (three) times daily. 12/03/17   Paulette Blanch, MD  butalbital-acetaminophen-caffeine (FIORICET) [phone] MG tablet Take 1-2 tablets by mouth every 6 (six) hours as needed for headache. 08/24/20 08/24/21  Lavonia Drafts, MD  meloxicam (MOBIC) 15 MG tablet Take 1 tablet (15  mg total) by mouth daily. 07/18/13   Hyatt, Max T, DPM  methylPREDNIsolone (MEDROL DOSPACK) 4 MG tablet follow package directions 07/18/13   Hyatt, Max T, DPM  oxyCODONE-acetaminophen (PERCOCET) 5-325 MG tablet Take 1 tablet by mouth every 4 (four) hours as needed. 03/08/20 03/08/21  Gregor Hams, MD    Allergies Bee venom, Microplegia msa-msg [plegisol], and Monosodium glutamate  History reviewed. No pertinent family history.  Social History Social History   Tobacco Use   Smoking status: Former    Pack years: 0.00   Smokeless tobacco: Never  Vaping Use   Vaping Use: Never used  Substance Use Topics   Alcohol use: Yes    Comment: occ   Drug use: No    Review of Systems  Constitutional: Negative for fever. Eyes: Negative for visual changes. ENT: Negative for sore throat. Neck: No neck pain  Cardiovascular: Negative for chest pain. Respiratory: Negative for shortness of breath. Gastrointestinal: +abdominal pain, vomiting and diarrhea. Genitourinary: Negative for dysuria. Musculoskeletal: Negative for back pain. Skin: Negative for rash. Neurological: Negative for headaches, weakness or numbness. Psych: No SI or HI  ____________________________________________   PHYSICAL EXAM:  VITAL SIGNS: ED Triage Vitals  Enc Vitals Group     BP 10/28/20 2334 (!) 121/55     Pulse Rate 10/28/20 2334 97     Resp 10/28/20 2334 20     Temp 10/28/20 2334  98.4 F (36.9 C)     Temp Source 10/28/20 2334 Oral     SpO2 10/28/20 2334 96 %     Weight 10/28/20 2334 280 lb (127 kg)     Height 10/28/20 2334 6' (1.829 m)     Head Circumference --      Peak Flow --      Pain Score 10/28/20 2343 9     Pain Loc --      Pain Edu? --      Excl. in [place]? --     Constitutional: Alert and oriented. Well appearing and in no apparent distress. HEENT:      Head: Normocephalic and atraumatic.         Eyes: Conjunctivae are normal. Sclera is non-icteric.       Mouth/Throat: Mucous membranes  are moist.       Neck: Supple with no signs of meningismus. Cardiovascular: Regular rate and rhythm. No murmurs, gallops, or rubs. 2+ symmetrical distal pulses are present in all extremities. No JVD. Respiratory: Normal respiratory effort. Lungs are clear to auscultation bilaterally.  Gastrointestinal: Soft, mildly diffuse tenderness to palpation, and non distended with positive bowel sounds. No rebound or guarding. Genitourinary: No CVA tenderness. Musculoskeletal:  No edema, cyanosis, or erythema of extremities. Neurologic: Normal speech and language. Face is symmetric. Moving all extremities. No gross focal neurologic deficits are appreciated. Skin: Skin is warm, dry and intact. No rash noted. Psychiatric: Mood and affect are normal. Speech and behavior are normal.  ____________________________________________   LABS (all labs ordered are listed, but only abnormal results are displayed)  Labs Reviewed  COMPREHENSIVE METABOLIC PANEL - Abnormal; Notable for the following components:      Result Value   CO2 20 (*)    Glucose, Bld 176 (*)    All other components within normal limits  CBC - Abnormal; Notable for the following components:   WBC 11.6 (*)    RBC 6.00 (*)    All other components within normal limits  LIPASE, BLOOD  URINALYSIS, COMPLETE (UACMP) WITH MICROSCOPIC  TROPONIN I (HIGH SENSITIVITY)  TROPONIN I (HIGH SENSITIVITY)   ____________________________________________  EKG  ED ECG REPORT I, Rudene Re, the attending physician, personally viewed and interpreted this ECG.  Sinus rhythm, rate of 86, normal intervals, normal axis, no ST elevations or depressions. ____________________________________________  RADIOLOGY  none   ____________________________________________   PROCEDURES  Procedure(s) performed:yes .1-3 Lead EKG Interpretation  Date/Time: 10/29/2020 3:51 AM Performed by: Rudene Re, MD Authorized by: Rudene Re, MD      Interpretation: non-specific     ECG rate assessment: normal     Rhythm: sinus rhythm     Ectopy: none     Conduction: normal   Critical Care performed:  None ____________________________________________   INITIAL IMPRESSION / ASSESSMENT AND PLAN / ED COURSE  65 y.o. male with a history of remote pancreatic cancer, diabetes who presents for evaluation of generalized abdominal cramping, vomiting and diarrhea.  Patient is concerned for food poisoning as his symptoms started 2 hours after eating at [employer].  Patient is able dynamically stable, reports markedly improvement of his symptoms after getting Zofran in triage.  Abdomen is mild diffusely tender to palpation with no localized tenderness, rebound or guarding.  Differential diagnosis including food poisoning versus gastroenteritis versus colitis versus diverticulitis.  Labs showing mildly elevated white count of 11.6.  Normal LFTs, normal lipase, no significant electrolyte derangements.  Mild hyperglycemia with no evidence of DKA.  EKG with no signs of ischemia.  Will treat symptomatically with IV fluids, Zofran, Pepcid, Toradol and reassess.  Patient placed on telemetry for monitoring.  _________________________ 4:39 AM on 10/29/2020 ----------------------------------------- Patient feels markedly improved.  Tolerating p.o. no further episodes of vomiting.  Will discharge home with Zofran, bland diet, increase oral hydration follow-up with PCP.  Recommended return to the emergency room if patient spikes a fever or develops abdominal pain.  Plan discussed with patient and his wife was at bedside.    _____________________________________________ Please note:  Patient was evaluated in Emergency Department today for the symptoms described in the history of present illness. Patient was evaluated in the context of the global COVID-19 pandemic, which necessitated consideration that the patient might be at risk for infection with  the SARS-CoV-2 virus that causes COVID-19. Institutional protocols and algorithms that pertain to the evaluation of patients at risk for COVID-19 are in a state of rapid change based on information released by regulatory bodies including the CDC and federal and state organizations. These policies and algorithms were followed during the patient's care in the ED.  Some ED evaluations and interventions may be delayed as a result of limited staffing during the pandemic.   [place] Controlled Substance Database was reviewed by me. ____________________________________________   FINAL CLINICAL IMPRESSION(S) / ED DIAGNOSES   Final diagnoses:  Nausea vomiting and diarrhea      NEW MEDICATIONS STARTED DURING THIS VISIT:  ED Discharge Orders          Ordered    ondansetron (ZOFRAN ODT) 4 MG disintegrating tablet  Every 8 hours PRN        10/29/20 0427             Note:  This document was prepared using Dragon voice recognition software and may include unintentional dictation errors.    Alfred Levins, [place], MD 10/29/20 [phone]

## 2020-10-29 NOTE — ED Notes (Signed)
Pt given water and crackers at this time.

## 2020-10-29 NOTE — ED Notes (Signed)
ED Provider at bedside. 

## 2020-11-10 DIAGNOSIS — C61 Malignant neoplasm of prostate: Secondary | ICD-10-CM | POA: Diagnosis not present

## 2020-12-08 DIAGNOSIS — E119 Type 2 diabetes mellitus without complications: Secondary | ICD-10-CM | POA: Diagnosis not present

## 2020-12-08 DIAGNOSIS — E782 Mixed hyperlipidemia: Secondary | ICD-10-CM | POA: Diagnosis not present

## 2020-12-08 DIAGNOSIS — G43109 Migraine with aura, not intractable, without status migrainosus: Secondary | ICD-10-CM | POA: Diagnosis not present

## 2021-01-07 DIAGNOSIS — N453 Epididymo-orchitis: Secondary | ICD-10-CM | POA: Diagnosis not present

## 2021-02-12 DIAGNOSIS — C61 Malignant neoplasm of prostate: Secondary | ICD-10-CM | POA: Diagnosis not present

## 2021-03-02 ENCOUNTER — Encounter: Payer: Self-pay | Admitting: Emergency Medicine

## 2021-03-02 ENCOUNTER — Other Ambulatory Visit: Payer: Self-pay

## 2021-03-02 ENCOUNTER — Ambulatory Visit
Admission: EM | Admit: 2021-03-02 | Discharge: 2021-03-02 | Disposition: A | Discharge status: Home / Self Care | Payer: 59 | Attending: Physician Assistant | Admitting: Physician Assistant

## 2021-03-02 DIAGNOSIS — B349 Viral infection, unspecified: Secondary | ICD-10-CM

## 2021-03-02 DIAGNOSIS — R051 Acute cough: Secondary | ICD-10-CM

## 2021-03-02 MED ORDER — PREDNISONE 20 MG PO TABS: 40.0000 mg | ORAL_TABLET | Freq: Every day | ORAL | 0 refills | Status: AC

## 2021-03-02 MED ORDER — PSEUDOEPH-BROMPHEN-DM 30-2-10 MG/5ML PO SYRP: 10.0000 mL | ORAL_SOLUTION | Freq: Four times a day (QID) | ORAL | 0 refills | Status: AC | PRN

## 2021-03-02 NOTE — ED Triage Notes (Addendum)
Pt presents today with c/o of chest congestion x 3 days. Denies fever or cough. He reports being seen last Friday at [facility] and was given Prednisone tabs. He has been taking Nyquil at home, no meds pta. *Declines Covid testing today.

## 2021-03-02 NOTE — ED Provider Notes (Signed)
[facility]    CSN: [id number] Arrival date & time: 03/02/21  1053      History   Chief Complaint Chief Complaint  Patient presents with   chest congestion    HPI Gregory Haley is a 65 y.o. male presenting for 4-5-day history of nasal congestion and productive cough.  Symptoms initially started with a scratchy throat but that has resolved.  Admits to postnasal drainage.  No fever or body aches.  States it feels like something is stuck in his chest.  He was seen at [facility] 4 days ago and her negative COVID test.  He was prescribed 3 tablets of prednisone and he says that seemed to help.  Has been taking over-the-counter cough medication.  No history of allergies, asthma or COPD.  Former smoker but says he quit 14 years ago.  Denies breathing difficulty or chest pain.  No other complaints.  HPI  Past Medical History:  Diagnosis Date   Cancer [facility])    prostate    There are no problems to display for this patient.   Past Surgical History:  Procedure Laterality Date   ANKLE SURGERY Right    x 7   APPENDECTOMY     BACK SURGERY     EYE SURGERY     HERNIA REPAIR     INNER EAR SURGERY     KIDNEY SURGERY     PROSTATE SURGERY     VASECTOMY         Home Medications    Prior to Admission medications   Medication Sig Start Date End Date Taking? Authorizing Provider  brompheniramine-pseudoephedrine-DM 30-2-10 MG/5ML syrup Take 10 mLs by mouth 4 (four) times daily as needed for up to 7 days. 03/02/21 03/09/21 Yes Danton Clap, PA-C  predniSONE (DELTASONE) 20 MG tablet Take 2 tablets (40 mg total) by mouth daily for 5 days. 03/02/21 03/07/21 Yes Danton Clap, PA-C  butalbital-acetaminophen-caffeine (FIORICET) [phone] MG tablet Take 1-2 tablets by mouth every 6 (six) hours as needed for headache. 08/24/20 08/24/21  Lavonia Drafts, MD    Family History History reviewed. No pertinent family history.  Social History Social History   Tobacco Use    Smoking status: Former   Smokeless tobacco: Never  Scientific laboratory technician Use: Never used  Substance Use Topics   Alcohol use: Yes    Comment: occ   Drug use: No     Allergies   Bee venom, Microplegia msa-msg [plegisol], and Monosodium glutamate   Review of Systems Review of Systems  Constitutional:  Negative for fatigue and fever.  HENT:  Positive for congestion and rhinorrhea. Negative for sinus pressure, sinus pain and sore throat.   Respiratory:  Positive for cough and chest tightness. Negative for shortness of breath.   Cardiovascular:  Negative for chest pain.  Gastrointestinal:  Negative for abdominal pain, diarrhea, nausea and vomiting.  Musculoskeletal:  Negative for myalgias.  Neurological:  Negative for weakness, light-headedness and headaches.  Hematological:  Negative for adenopathy.    Physical Exam Triage Vital Signs ED Triage Vitals  Enc Vitals Group     BP 03/02/21 1208 (!) 142/81     Pulse Rate 03/02/21 1208 71     Resp 03/02/21 1208 18     Temp 03/02/21 1208 98.1 F (36.7 C)     Temp Source 03/02/21 1208 Oral     SpO2 03/02/21 1208 97 %     Weight --  Height --      Head Circumference --      Peak Flow --      Pain Score 03/02/21 1205 0     Pain Loc --      Pain Edu? --      Excl. in [place]? --    No data found.  Updated Vital Signs BP (!) 142/81 (BP Location: Right Arm)   Pulse 71   Temp 98.1 F (36.7 C) (Oral)   Resp 18   SpO2 97%   Physical Exam Vitals and nursing note reviewed.  Constitutional:      General: He is not in acute distress.    Appearance: Normal appearance. He is well-developed. He is not ill-appearing or diaphoretic.  HENT:     Head: Normocephalic and atraumatic.     Nose: Congestion present.     Mouth/Throat:     Mouth: Mucous membranes are moist.     Pharynx: Oropharynx is clear. Uvula midline. No posterior oropharyngeal erythema.     Tonsils: No tonsillar abscesses.  Eyes:     General: No scleral icterus.        Right eye: No discharge.        Left eye: No discharge.     Conjunctiva/sclera: Conjunctivae normal.  Neck:     Thyroid: No thyromegaly.     Trachea: No tracheal deviation.  Cardiovascular:     Rate and Rhythm: Normal rate and regular rhythm.     Heart sounds: Normal heart sounds.  Pulmonary:     Effort: Pulmonary effort is normal. No respiratory distress.     Breath sounds: Normal breath sounds. No wheezing, rhonchi or rales.  Musculoskeletal:     Cervical back: Neck supple.  Skin:    General: Skin is warm and dry.     Findings: No rash.  Neurological:     General: No focal deficit present.     Mental Status: He is alert. Mental status is at baseline.     Motor: No weakness.     Coordination: Coordination normal.     Gait: Gait normal.  Psychiatric:        Mood and Affect: Mood normal.        Behavior: Behavior normal.        Thought Content: Thought content normal.     UC Treatments / Results  Labs (all labs ordered are listed, but only abnormal results are displayed) Labs Reviewed - No data to display  EKG   Radiology No results found.  Procedures Procedures (including critical care time)  Medications Ordered in UC Medications - No data to display  Initial Impression / Assessment and Plan / UC Course  I have reviewed the triage vital signs and the nursing notes.  Pertinent labs & imaging results that were available during my care of the patient were reviewed by me and considered in my medical decision making (see chart for details).  65 year old male presenting for 4-5-day history of nasal congestion and productive cough.  Negative COVID test.  Clinical presentation is consistent with acute bronchitis.  Since the prednisone helped, will try a longer trial and also prescribed Bromfed-DM.  He declines a repeat COVID test.  Encouraged him to increase rest and fluids and follow-up for fever, increased chest tightness or breathing difficulty.  Work note  given.   Final Clinical Impressions(s) / UC Diagnoses   Final diagnoses:  Viral illness  Acute cough     Discharge Instructions  URI/COLD SYMPTOMS: Your exam today is consistent with a viral illness. Antibiotics are not indicated at this time. Use medications as directed, including cough syrup, nasal saline, and decongestants. Your symptoms should improve over the next few days and resolve within 7-10 days. Increase rest and fluids. F/u if symptoms worsen or predominate such as sore throat, ear pain, productive cough, shortness of breath, or if you develop high fevers or worsening fatigue over the next several days.       ED Prescriptions     Medication Sig Dispense Auth. Provider   predniSONE (DELTASONE) 20 MG tablet Take 2 tablets (40 mg total) by mouth daily for 5 days. 10 tablet Laurene Footman B, PA-C   brompheniramine-pseudoephedrine-DM 30-2-10 MG/5ML syrup Take 10 mLs by mouth 4 (four) times daily as needed for up to 7 days. 150 mL Danton Clap, PA-C      PDMP not reviewed this encounter.   Danton Clap, PA-C 03/02/21 1324

## 2021-03-02 NOTE — Discharge Instructions (Addendum)
URI/COLD SYMPTOMS: Your exam today is consistent with a viral illness. Antibiotics are not indicated at this time. Use medications as directed, including cough syrup, nasal saline, and decongestants. Your symptoms should improve over the next few days and resolve within 7-10 days. Increase rest and fluids. F/u if symptoms worsen or predominate such as sore throat, ear pain, productive cough, shortness of breath, or if you develop high fevers or worsening fatigue over the next several days.    

## 2021-03-24 IMAGING — CR DG KNEE COMPLETE 4+V*R*
4 series · 4 of 4 positions shown · non-contrast
Comparison: None.

CLINICAL DATA: 63-year-old male with right knee pain.

EXAM:
RIGHT KNEE - COMPLETE 4+ VIEW

[knee ap]
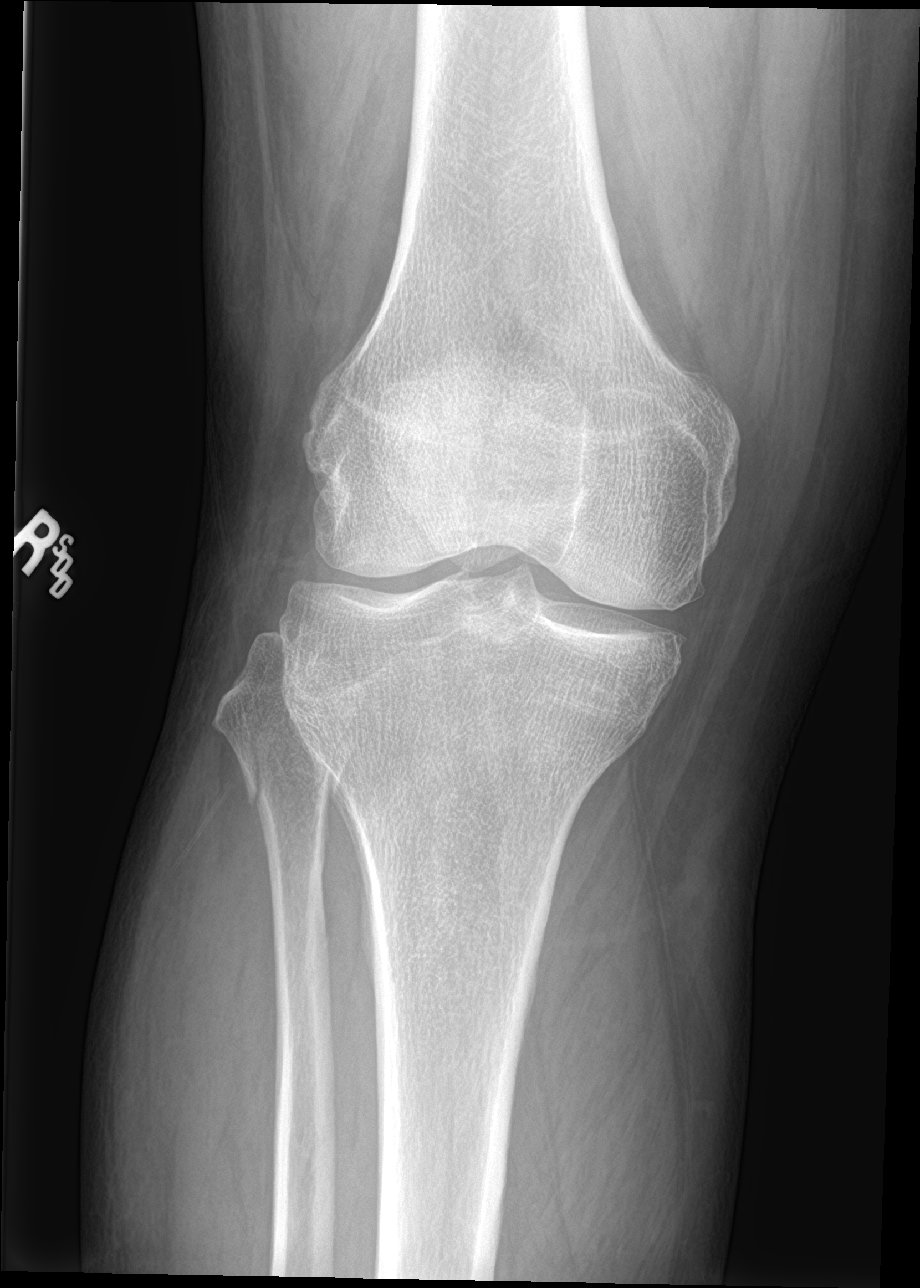

[knee obl (1 of 2)]
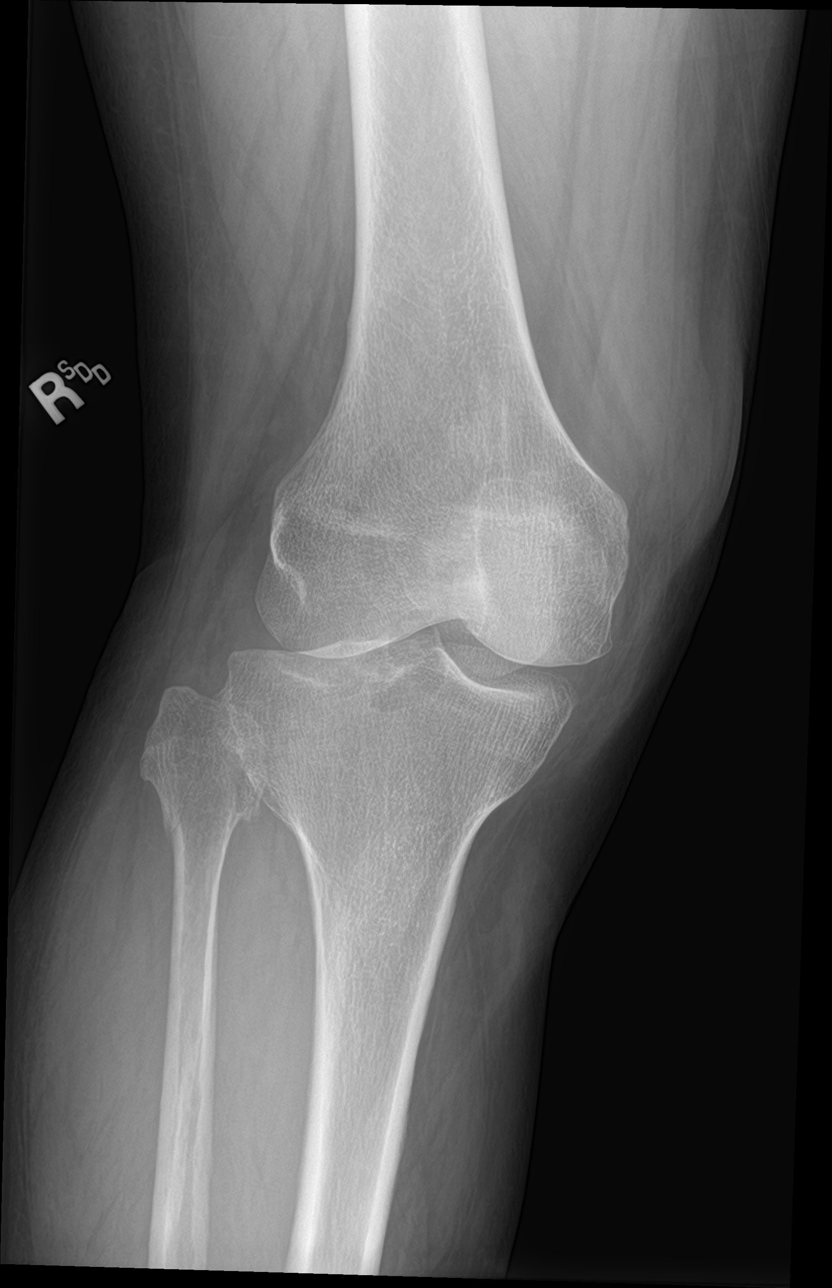

[knee obl (2 of 2)]
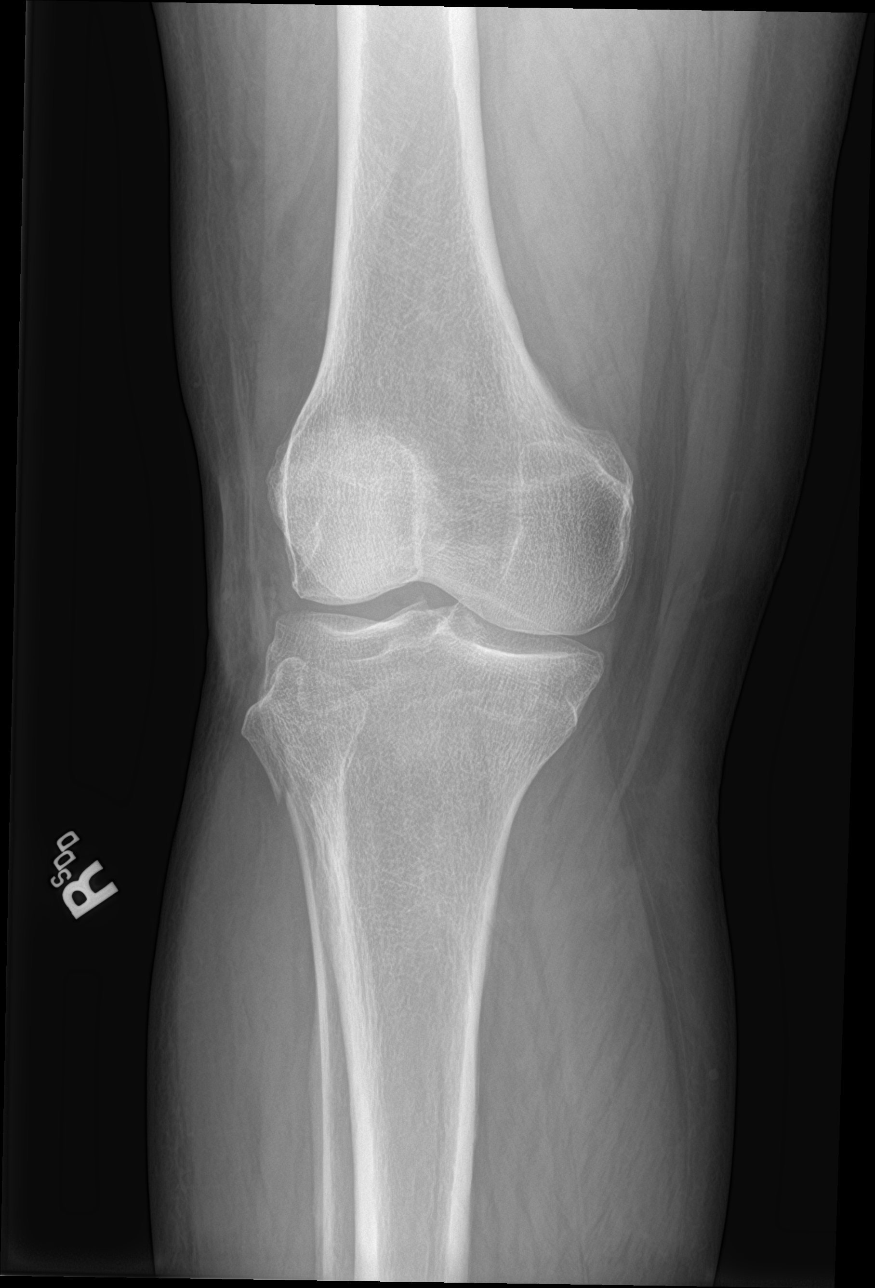

[knee lat]
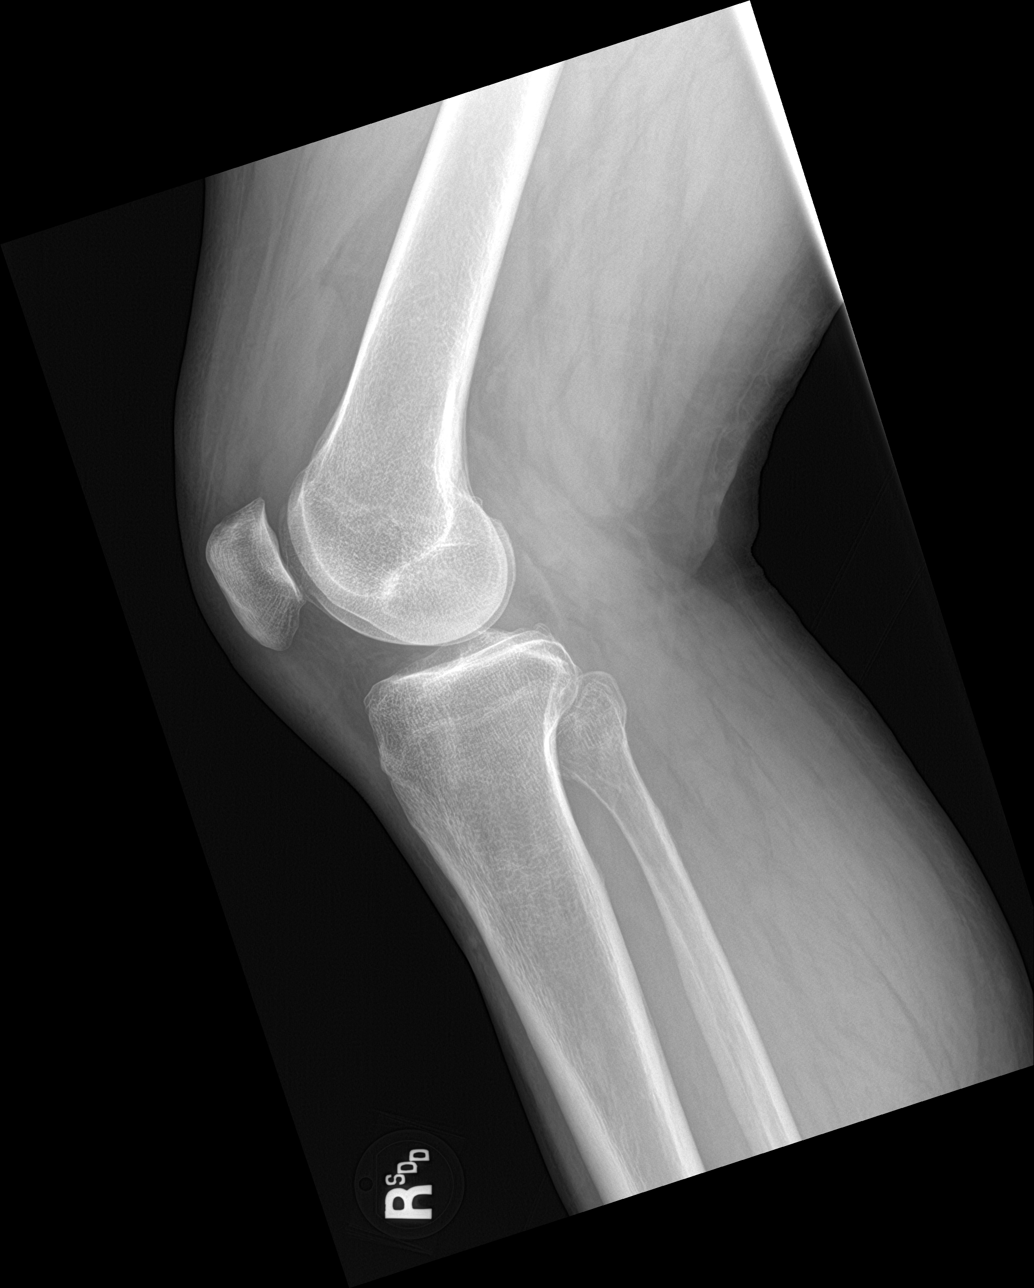

[4 of 4 positions shown; findings below may reference images not displayed]

FINDINGS: Nondisplaced fracture of the fibular neck. No other acute fracture
identified. The bones are mildly osteopenic. No dislocation. No
significant arthritic changes. There is a small suprapatellar
effusion. The soft tissues are unremarkable.
IMPRESSION: Nondisplaced fracture of the fibular neck.

## 2021-03-24 IMAGING — CR DG TIBIA/FIBULA 2V*R*
3 series · 3 of 3 positions shown · non-contrast
Comparison: None.

CLINICAL DATA: Pain, status post fall

EXAM:
RIGHT TIBIA AND FIBULA - 2 VIEW

[tibia ap]
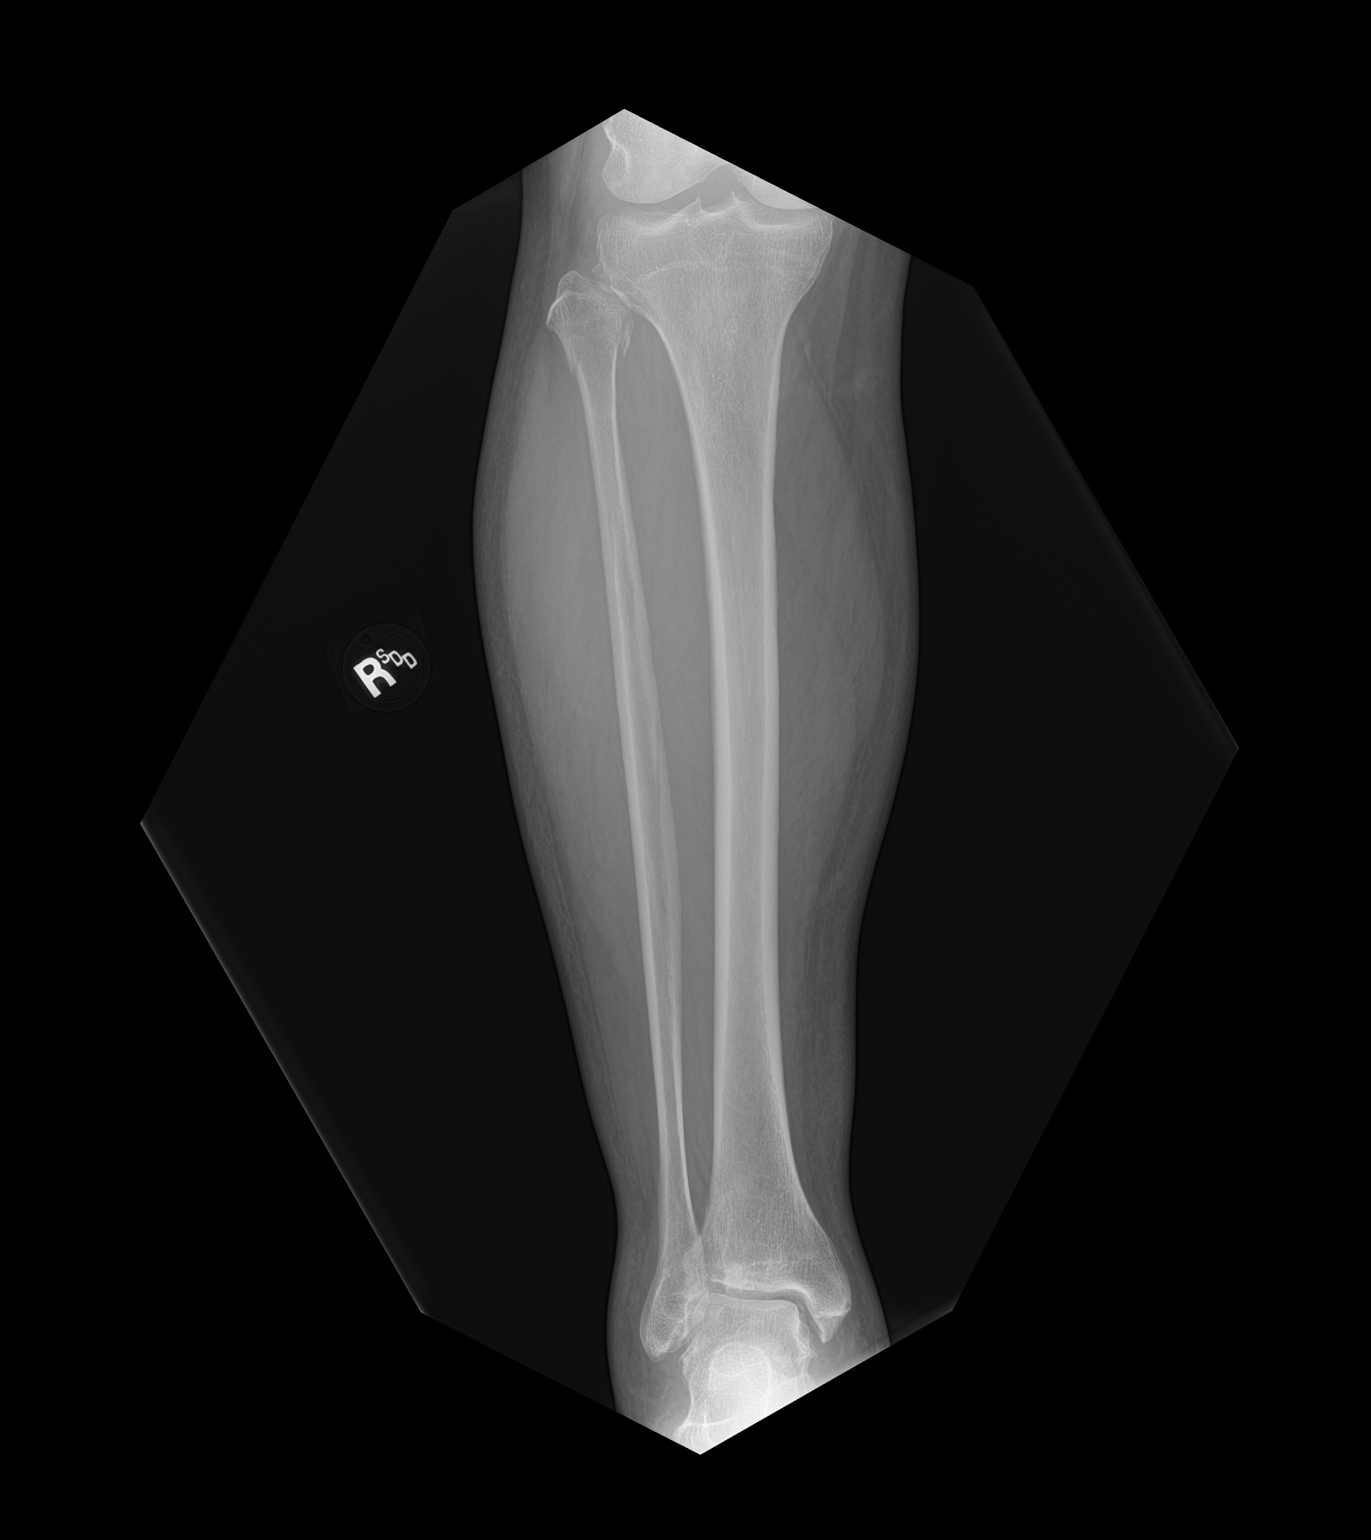

[tibia lat (1 of 2)]
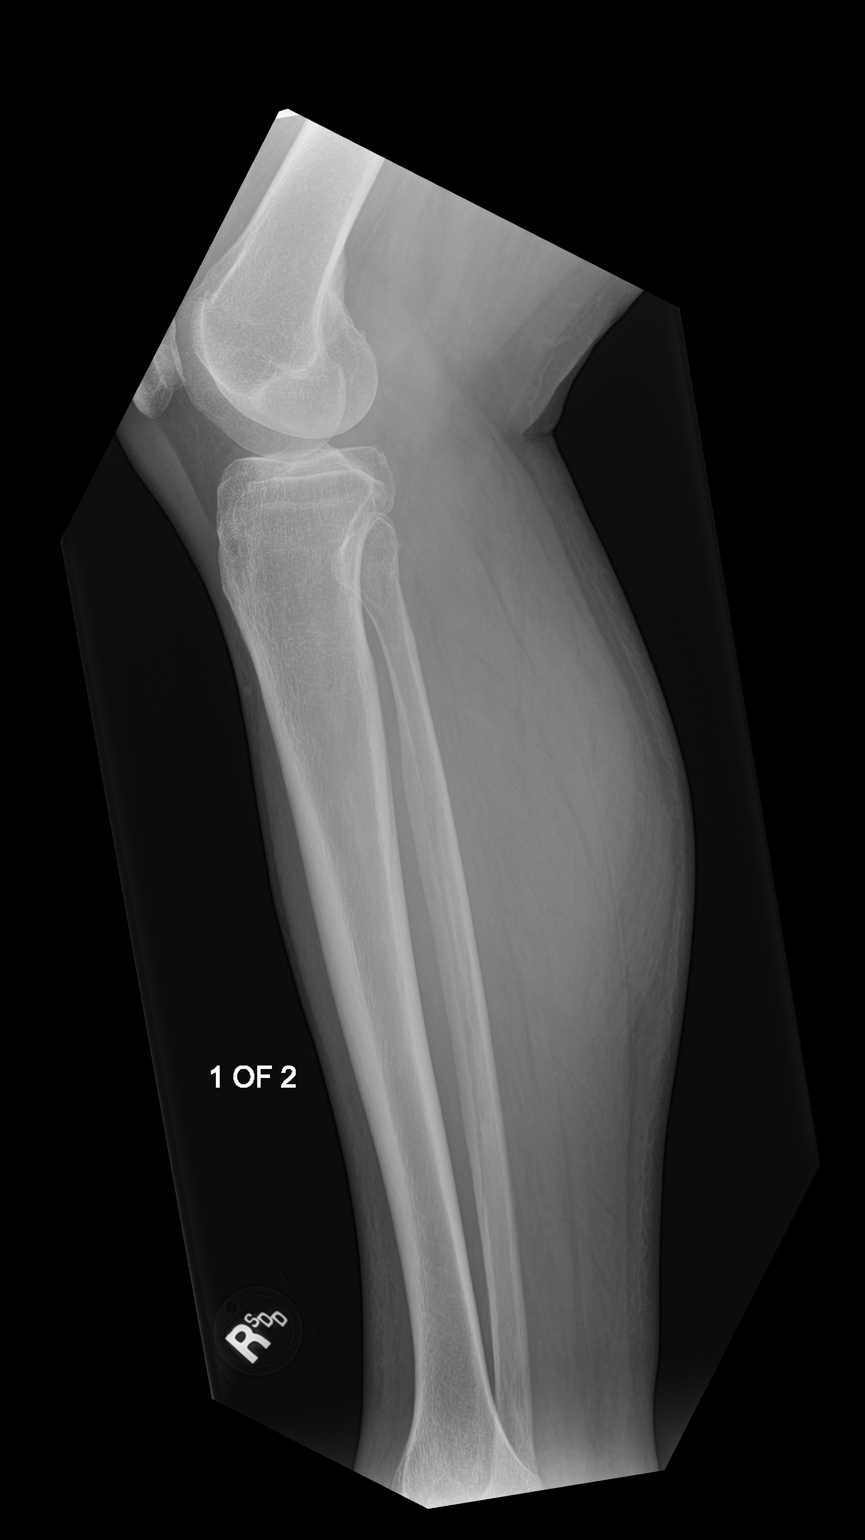

[tibia lat (2 of 2)]
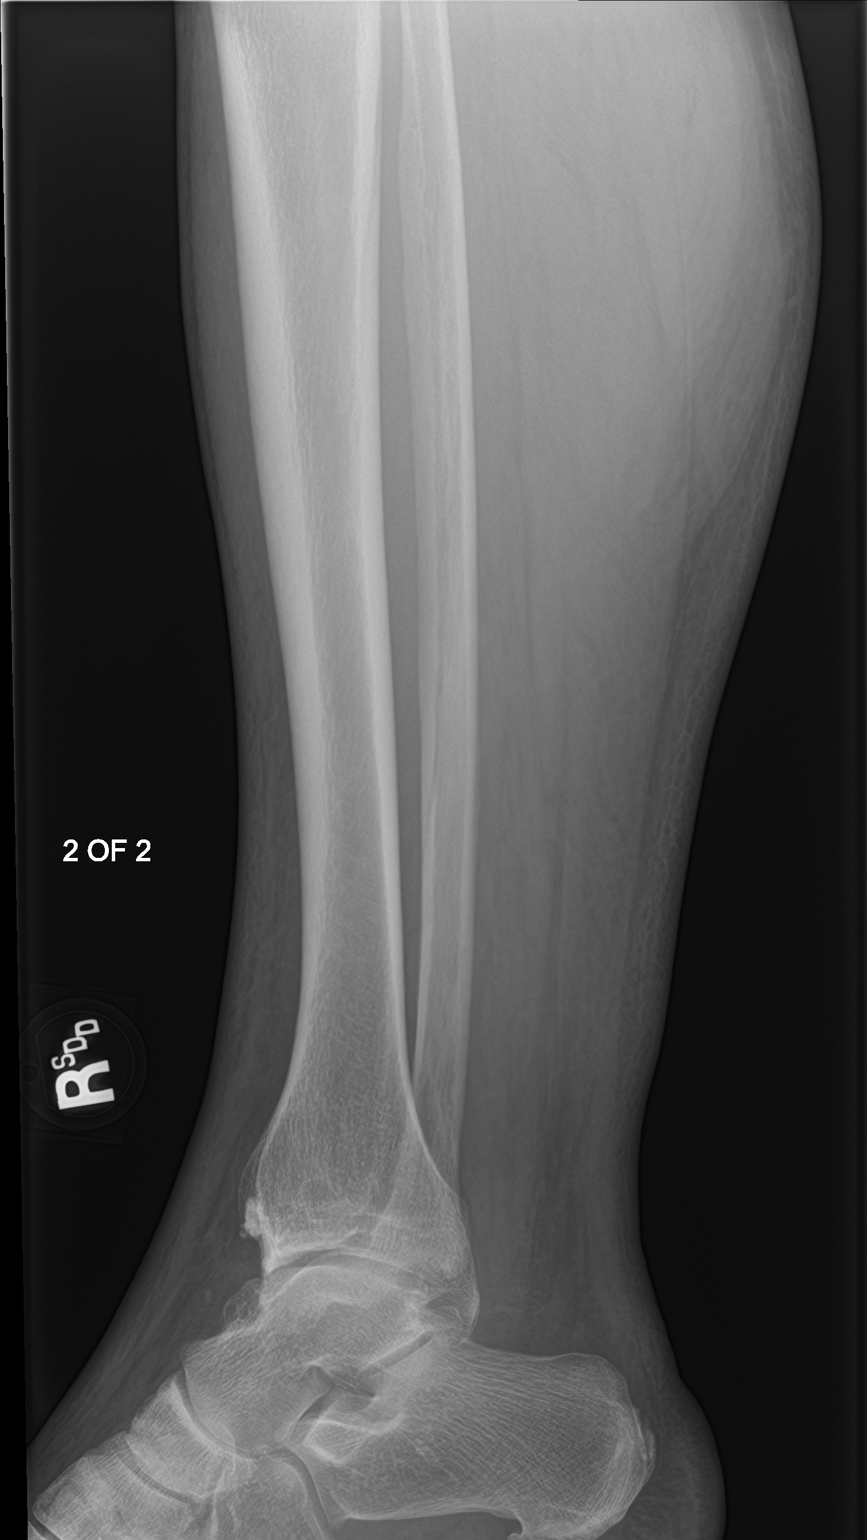

[3 of 3 positions shown; findings below may reference images not displayed]

FINDINGS: Nondisplaced transverse fracture involving the fibular head/neck.

Tibia appears intact.

Visualized soft tissues are within normal limits.
IMPRESSION: Nondisplaced transverse fracture involving the fibular head/neck.

## 2021-09-23 DIAGNOSIS — Z905 Acquired absence of kidney: Secondary | ICD-10-CM | POA: Diagnosis not present

## 2021-09-23 DIAGNOSIS — D7389 Other diseases of spleen: Secondary | ICD-10-CM | POA: Diagnosis not present

## 2021-09-23 DIAGNOSIS — Z85528 Personal history of other malignant neoplasm of kidney: Secondary | ICD-10-CM | POA: Diagnosis not present

## 2021-09-23 DIAGNOSIS — C649 Malignant neoplasm of unspecified kidney, except renal pelvis: Secondary | ICD-10-CM | POA: Diagnosis not present

## 2022-05-17 ENCOUNTER — Ambulatory Visit
Admission: EM | Admit: 2022-05-17 | Discharge: 2022-05-17 | Disposition: A | Discharge status: Home / Self Care | Payer: BC Managed Care – PPO | Attending: Internal Medicine | Admitting: Internal Medicine

## 2022-05-17 DIAGNOSIS — U071 COVID-19: Secondary | ICD-10-CM | POA: Insufficient documentation

## 2022-05-17 LAB — RESP PANEL BY RT-PCR (RSV, FLU A&B, COVID)  RVPGX2
Influenza A by PCR: NEGATIVE
Influenza B by PCR: NEGATIVE
Resp Syncytial Virus by PCR: NEGATIVE
SARS Coronavirus 2 by RT PCR: POSITIVE — AB

## 2022-05-17 MED ORDER — NIRMATRELVIR/RITONAVIR (PAXLOVID)TABLET: 3.0000 | ORAL_TABLET | Freq: Two times a day (BID) | ORAL | 0 refills | Status: AC

## 2022-05-17 MED ORDER — NIRMATRELVIR/RITONAVIR (PAXLOVID)TABLET: 3.0000 | ORAL_TABLET | Freq: Two times a day (BID) | ORAL | 0 refills | Status: DC

## 2022-05-17 MED ORDER — BENZONATATE 200 MG PO CAPS: 200.0000 mg | ORAL_CAPSULE | Freq: Three times a day (TID) | ORAL | 0 refills | Status: DC | PRN

## 2022-05-17 NOTE — ED Provider Notes (Signed)
[facility]    CSN: [id number] Arrival date & time: 05/17/22  1541      History   Chief Complaint Chief Complaint  Patient presents with   Cough   Generalized Body Aches   Congestion    HPI Gregory Haley is a 66 y.o. male who presents with onset of cough and body aches 2 days ago.Has had low grade temp. Has been a coworker who had influenza, and another one who has not been seen by MD yet. Pt has not had any covid shots. Had covid infection 2021   Past Medical History:  Diagnosis Date   Cancer [facility])    prostate    There are no problems to display for this patient.   Past Surgical History:  Procedure Laterality Date   ANKLE SURGERY Right    x 7   APPENDECTOMY     BACK SURGERY     EYE SURGERY     HERNIA REPAIR     INNER EAR SURGERY     KIDNEY SURGERY     PROSTATE SURGERY     VASECTOMY         Home Medications    Prior to Admission medications   Medication Sig Start Date End Date Taking? Authorizing Provider  benzonatate (TESSALON) 200 MG capsule Take 1 capsule (200 mg total) by mouth 3 (three) times daily as needed for cough. 05/17/22  Yes Rodriguez-Southworth, Sunday Spillers, PA-C  metFORMIN (GLUCOPHAGE-XR) 500 MG 24 hr tablet Take by mouth. 02/28/22 02/28/23 Yes [provider]  Multiple Vitamin (MULTI-VITAMIN) tablet Take 1 tablet by mouth daily.   Yes [provider]  rosuvastatin (CRESTOR) 40 MG tablet Take by mouth. 03/10/22 03/10/23 Yes [provider]  nirmatrelvir/ritonavir (PAXLOVID) 20 x 150 MG & 10 x 100MG TABS Take 3 tablets by mouth 2 (two) times daily for 5 days. Patient GFR is >60 Take nirmatrelvir (150 mg) two tablets twice daily for 5 days and ritonavir (100 mg) one tablet twice daily for 5 days. 05/17/22 05/22/22  Rodriguez-Southworth, Sunday Spillers, PA-C    Family History History reviewed. No pertinent family history.  Social History Social History   Tobacco Use   Smoking status: Former   Smokeless tobacco:  Never  Scientific laboratory technician Use: Never used  Substance Use Topics   Alcohol use: Yes    Comment: occ   Drug use: No     Allergies   Bee venom, Microplegia msa-msg [cardioplegia del nido formula], Monosodium glutamate, and Plegisol   Review of Systems Review of Systems  Constitutional:  Positive for appetite change, chills, diaphoresis, fatigue and fever. Negative for activity change.  HENT:  Positive for rhinorrhea. Negative for ear discharge and ear pain.   Eyes:  Negative for discharge.  Respiratory:  Positive for cough. Negative for chest tightness, shortness of breath and wheezing.      Physical Exam Triage Vital Signs ED Triage Vitals  Enc Vitals Group     BP 05/17/22 1739 (!) 137/104     Pulse Rate 05/17/22 1739 95     Resp 05/17/22 1739 16     Temp 05/17/22 1739 98.6 F (37 C)     Temp Source 05/17/22 1739 Oral     SpO2 05/17/22 1739 96 %     Weight 05/17/22 1737 270 lb (122.5 kg)     Height 05/17/22 1737 6' (1.829 m)     Head Circumference --      Peak Flow --  Pain Score 05/17/22 1737 7     Pain Loc --      Pain Edu? --      Excl. in [place]? --    No data found.  Updated Vital Signs BP (!) 137/104 (BP Location: Left Arm)   Pulse 95   Temp 98.6 F (37 C) (Oral)   Resp 16   Ht 6' (1.829 m)   Wt 270 lb (122.5 kg)   SpO2 96%   BMI 36.62 kg/m   Visual Acuity Right Eye Distance:   Left Eye Distance:   Bilateral Distance:    Right Eye Near:   Left Eye Near:    Bilateral Near:      Physical Exam Vitals signs and nursing note reviewed.  Constitutional:      General: he is not in acute distress.    Appearance: Normal appearance. He is not ill-appearing, toxic-appearing or diaphoretic.  HENT:     Head: Normocephalic.     Right Ear: Tympanic membrane, ear canal and external ear normal.     Left Ear: Tympanic membrane, ear canal and external ear normal.     Nose: Nose normal.     Mouth/Throat: clear     Mouth: Mucous membranes are moist.   Eyes:     General: No scleral icterus.       Right eye: No discharge.        Left eye: No discharge.     Conjunctiva/sclera: Conjunctivae normal.  Neck:     Musculoskeletal: Neck supple. No neck rigidity.  Cardiovascular:     Rate and Rhythm: Normal rate and regular rhythm.     Heart sounds: No murmur.  Pulmonary:     Effort: Pulmonary effort is normal.     Breath sounds: Normal breath sounds.  Musculoskeletal: Normal range of motion.  Lymphadenopathy:     Cervical: No cervical adenopathy.  Skin:    General: Skin is warm and dry.     Coloration: Skin is not jaundiced.     Findings: No rash.  Neurological:     Mental Status: he is alert and oriented to person, place, and time.     Gait: Gait normal.  Psychiatric:        Mood and Affect: Mood normal.        Behavior: Behavior normal.        Thought Content: Thought content normal.        Judgment: Judgment normal.    UC Treatments / Results  Labs (all labs ordered are listed, but only abnormal results are displayed) Labs Reviewed  RESP PANEL BY RT-PCR (RSV, FLU A&B, COVID)  [id number] - Abnormal; Notable for the following components:      Result Value   SARS Coronavirus 2 by RT PCR POSITIVE (*)    All other components within normal limits  Flu and RSV test are negative  EKG   Radiology No results found.  Procedures Procedures (including critical care time)  Medications Ordered in UC Medications - No data to display  Initial Impression / Assessment and Plan / UC Course  I have reviewed the triage vital signs and the nursing notes.  Pertinent labs  results that were available during my care of the patient were reviewed by me and considered in my medical decision making (see chart for details).   Covid infection  I have reviewed his CMP from 12/18 and renal function and eGFR are normal, so I placed him on Paxlovid as noted and tessalon  for his cough.   Final Clinical Impressions(s) / UC Diagnoses   Final  diagnoses:  [id number] virus infection     Discharge Instructions      Your flu and RSV test are negative, your covid test is positive Stop the Crestor while on the antiviral medication for covid. Get back on it when done.  Stay quarantined for 5 days from onset of symptoms, and the other 5 days you may go out but need to wear a mask.     ED Prescriptions     Medication Sig Dispense Auth. Provider   benzonatate (TESSALON) 200 MG capsule Take 1 capsule (200 mg total) by mouth 3 (three) times daily as needed for cough. 30 capsule Rodriguez-Southworth, Sunday Spillers, PA-C   nirmatrelvir/ritonavir (PAXLOVID) 20 x 150 MG & 10 x 100MG TABS  (Status: Discontinued) Take 3 tablets by mouth 2 (two) times daily for 5 days. Patient GFR is >60 Take nirmatrelvir (150 mg) two tablets twice daily for 5 days and ritonavir (100 mg) one tablet twice daily for 5 days. 30 tablet Rodriguez-Southworth, Sunday Spillers, PA-C   nirmatrelvir/ritonavir (PAXLOVID) 20 x 150 MG & 10 x 100MG TABS Take 3 tablets by mouth 2 (two) times daily for 5 days. Patient GFR is >60 Take nirmatrelvir (150 mg) two tablets twice daily for 5 days and ritonavir (100 mg) one tablet twice daily for 5 days. 30 tablet Rodriguez-Southworth, Sunday Spillers, PA-C      PDMP not reviewed this encounter.   Shelby Mattocks, Hershal Coria 05/17/22 1902

## 2022-05-17 NOTE — Discharge Instructions (Addendum)
Your flu and RSV test are negative, your covid test is positive Stop the Crestor while on the antiviral medication for covid. Get back on it when done.  Stay quarantined for 5 days from onset of symptoms, and the other 5 days you may go out but need to wear a mask.

## 2022-05-17 NOTE — ED Triage Notes (Signed)
Pt c/o cough,chest congestion & bodyaches x2 days. Otc nyquil w/minor relief.

## 2023-03-27 ENCOUNTER — Ambulatory Visit
Admission: EM | Admit: 2023-03-27 | Discharge: 2023-03-27 | Disposition: A | Discharge status: Home / Self Care | Payer: BC Managed Care – PPO | Attending: Physician Assistant | Admitting: Physician Assistant

## 2023-03-27 ENCOUNTER — Encounter: Payer: Self-pay | Admitting: Emergency Medicine

## 2023-03-27 DIAGNOSIS — J069 Acute upper respiratory infection, unspecified: Secondary | ICD-10-CM | POA: Diagnosis present

## 2023-03-27 DIAGNOSIS — R051 Acute cough: Secondary | ICD-10-CM | POA: Diagnosis present

## 2023-03-27 DIAGNOSIS — J029 Acute pharyngitis, unspecified: Secondary | ICD-10-CM | POA: Diagnosis present

## 2023-03-27 LAB — GROUP A STREP BY PCR: Group A Strep by PCR: NOT DETECTED

## 2023-03-27 MED ORDER — PROMETHAZINE-DM 6.25-15 MG/5ML PO SYRP: 5.0000 mL | ORAL_SOLUTION | Freq: Four times a day (QID) | ORAL | 0 refills | Status: DC | PRN

## 2023-03-27 MED ORDER — IPRATROPIUM BROMIDE 0.06 % NA SOLN: 2.0000 | Freq: Four times a day (QID) | NASAL | 0 refills | Status: DC

## 2023-03-27 NOTE — ED Provider Notes (Signed)
[facility]    CSN: [id number] Arrival date & time: 03/27/23  0854      History   Chief Complaint Chief Complaint  Patient presents with   Sore Throat    HPI Gregory Haley is a 67 y.o. male presenting for sore throat, mild cough and congestion as well as fatigue and slight bodyaches since yesterday.  Denies fever, chest pain, shortness of breath, vomiting or diarrhea.  No sick contacts.  Not taking any OTC meds.  Patient says he does not believe he has COVID because he has had that before and this does not feel like it.  He thinks he may only need a antibiotic injection.  No other complaints.  HPI  Past Medical History:  Diagnosis Date   Cancer [facility])    prostate    There are no problems to display for this patient.   Past Surgical History:  Procedure Laterality Date   ANKLE SURGERY Right    x 7   APPENDECTOMY     BACK SURGERY     EYE SURGERY     HERNIA REPAIR     INNER EAR SURGERY     KIDNEY SURGERY     PROSTATE SURGERY     VASECTOMY         Home Medications    Prior to Admission medications   Medication Sig Start Date End Date Taking? Authorizing Provider  ipratropium (ATROVENT) 0.06 % nasal spray Place 2 sprays into both nostrils 4 (four) times daily. 03/27/23  Yes Shirlee Latch, PA-C  promethazine-dextromethorphan (PROMETHAZINE-DM) 6.25-15 MG/5ML syrup Take 5 mLs by mouth 4 (four) times daily as needed. 03/27/23  Yes Eusebio Friendly B, PA-C  benzonatate (TESSALON) 200 MG capsule Take 1 capsule (200 mg total) by mouth 3 (three) times daily as needed for cough. 05/17/22   Rodriguez-Southworth, Nettie Elm, PA-C  metFORMIN (GLUCOPHAGE-XR) 500 MG 24 hr tablet Take by mouth. 02/28/22 02/28/23  [provider]  Multiple Vitamin (MULTI-VITAMIN) tablet Take 1 tablet by mouth daily.    [provider]  rosuvastatin (CRESTOR) 40 MG tablet Take by mouth. 03/10/22 03/10/23  [provider]    Family History History reviewed. No  pertinent family history.  Social History Social History   Tobacco Use   Smoking status: Former   Smokeless tobacco: Never  Advertising account planner   Vaping status: Never Used  Substance Use Topics   Alcohol use: Yes    Comment: occ   Drug use: No     Allergies   Bee venom, Microplegia msa-msg [cardioplegia del nido formula], Monosodium glutamate, and Plegisol   Review of Systems Review of Systems  Constitutional:  Positive for fatigue. Negative for fever.  HENT:  Positive for congestion, rhinorrhea and sore throat. Negative for sinus pressure and sinus pain.   Respiratory:  Positive for cough. Negative for shortness of breath.   Cardiovascular:  Negative for chest pain.  Gastrointestinal:  Negative for abdominal pain, diarrhea, nausea and vomiting.  Musculoskeletal:  Positive for myalgias.  Neurological:  Negative for weakness, light-headedness and headaches.  Hematological:  Negative for adenopathy.     Physical Exam Triage Vital Signs ED Triage Vitals  Encounter Vitals Group     BP 03/27/23 0958 (!) 163/94     Systolic BP Percentile --      Diastolic BP Percentile --      Pulse Rate 03/27/23 0958 74     Resp 03/27/23 0958 18     Temp 03/27/23 0958 97.9  F (36.6 C)     Temp Source 03/27/23 0958 Oral     SpO2 03/27/23 0958 97 %     Weight --      Height --      Head Circumference --      Peak Flow --      Pain Score 03/27/23 0956 4     Pain Loc --      Pain Education --      Exclude from Growth Chart --    No data found.  Updated Vital Signs BP (!) 163/94 (BP Location: Right Arm)   Pulse 74   Temp 97.9 F (36.6 C) (Oral)   Resp 18   SpO2 97%    Physical Exam Vitals and nursing note reviewed.  Constitutional:      General: He is not in acute distress.    Appearance: Normal appearance. He is well-developed. He is not ill-appearing.  HENT:     Head: Normocephalic and atraumatic.     Nose: Congestion present.     Mouth/Throat:     Mouth: Mucous membranes  are moist.     Pharynx: Oropharynx is clear. Posterior oropharyngeal erythema present.  Eyes:     General: No scleral icterus.    Conjunctiva/sclera: Conjunctivae normal.  Cardiovascular:     Rate and Rhythm: Normal rate and regular rhythm.     Heart sounds: Normal heart sounds.  Pulmonary:     Effort: Pulmonary effort is normal. No respiratory distress.     Breath sounds: Normal breath sounds.  Musculoskeletal:     Cervical back: Neck supple.  Skin:    General: Skin is warm and dry.     Capillary Refill: Capillary refill takes less than 2 seconds.  Neurological:     General: No focal deficit present.     Mental Status: He is alert. Mental status is at baseline.     Motor: No weakness.     Gait: Gait normal.  Psychiatric:        Mood and Affect: Mood normal.        Behavior: Behavior normal.      UC Treatments / Results  Labs (all labs ordered are listed, but only abnormal results are displayed) Labs Reviewed  GROUP A STREP BY PCR    EKG   Radiology No results found.  Procedures Procedures (including critical care time)  Medications Ordered in UC Medications - No data to display  Initial Impression / Assessment and Plan / UC Course  I have reviewed the triage vital signs and the nursing notes.  Pertinent labs & imaging results that were available during my care of the patient were reviewed by me and considered in my medical decision making (see chart for details).   67 y/o male presents for sore throat, fatigue, cough, congestion and aches since yesterday.   Patient is afebrile and overall well-appearing.  On exam a slight congestion, mild posterior pharyngeal erythema.  Chest clear.  PCR strep test performed.  Negative. Patient declined COVID test.  Viral URI.  Supportive care encouraged with increasing rest and fluids.  Sent Promethazine DM and Atrovent nasal spray to pharmacy.  Also advised Tylenol, throat lozenges, Chloraseptic spray.  Explained that  symptoms should get better in the next 1 to 2 weeks.  Reviewed return precautions.   Final Clinical Impressions(s) / UC Diagnoses   Final diagnoses:  Viral upper respiratory tract infection  Sore throat  Acute cough     Discharge Instructions  URI/COLD SYMPTOMS: Your exam today is consistent with a viral illness. Antibiotics are not indicated at this time. Use medications as directed, including cough syrup, nasal saline, and decongestants. Your symptoms should improve over the next few days and resolve within 7-10 days. Increase rest and fluids. F/u if symptoms worsen or predominate such as sore throat, ear pain, productive cough, shortness of breath, or if you develop high fevers or worsening fatigue over the next several days.       ED Prescriptions     Medication Sig Dispense Auth. Provider   promethazine-dextromethorphan (PROMETHAZINE-DM) 6.25-15 MG/5ML syrup Take 5 mLs by mouth 4 (four) times daily as needed. 118 mL Eusebio Friendly B, PA-C   ipratropium (ATROVENT) 0.06 % nasal spray Place 2 sprays into both nostrils 4 (four) times daily. 15 mL Shirlee Latch, PA-C      PDMP not reviewed this encounter.   Shirlee Latch, PA-C 03/27/23 1103

## 2023-03-27 NOTE — ED Triage Notes (Signed)
Pt presents with a sore throat since yesterday.  

## 2023-03-27 NOTE — Discharge Instructions (Signed)

## 2023-04-24 ENCOUNTER — Ambulatory Visit: Payer: BC Managed Care – PPO

## 2023-04-24 ENCOUNTER — Ambulatory Visit
Admission: EM | Admit: 2023-04-24 | Discharge: 2023-04-24 | Disposition: A | Discharge status: Home / Self Care | Payer: BC Managed Care – PPO | Attending: Physician Assistant | Admitting: Physician Assistant

## 2023-04-24 DIAGNOSIS — J189 Pneumonia, unspecified organism: Secondary | ICD-10-CM

## 2023-04-24 DIAGNOSIS — R509 Fever, unspecified: Secondary | ICD-10-CM | POA: Diagnosis not present

## 2023-04-24 DIAGNOSIS — R051 Acute cough: Secondary | ICD-10-CM | POA: Diagnosis not present

## 2023-04-24 LAB — SARS CORONAVIRUS 2 BY RT PCR: SARS Coronavirus 2 by RT PCR: NEGATIVE

## 2023-04-24 MED ORDER — DEXAMETHASONE SODIUM PHOSPHATE 10 MG/ML IJ SOLN
10.0000 mg | Freq: Once | INTRAMUSCULAR | Status: AC
  Administered 2023-04-24: 10 mg via INTRAMUSCULAR

## 2023-04-24 MED ORDER — AZITHROMYCIN 250 MG PO TABS: 250.0000 mg | ORAL_TABLET | Freq: Every day | ORAL | 0 refills | Status: DC

## 2023-04-24 MED ORDER — ALBUTEROL SULFATE (2.5 MG/3ML) 0.083% IN NEBU
2.5000 mg | INHALATION_SOLUTION | Freq: Once | RESPIRATORY_TRACT | Status: AC
  Administered 2023-04-24: 2.5 mg via RESPIRATORY_TRACT

## 2023-04-24 MED ORDER — AMOXICILLIN-POT CLAVULANATE 875-125 MG PO TABS: 1.0000 | ORAL_TABLET | Freq: Two times a day (BID) | ORAL | 0 refills | Status: AC

## 2023-04-24 MED ORDER — PROMETHAZINE-DM 6.25-15 MG/5ML PO SYRP: 5.0000 mL | ORAL_SOLUTION | Freq: Four times a day (QID) | ORAL | 0 refills | Status: DC | PRN

## 2023-04-24 MED ORDER — ALBUTEROL SULFATE HFA 108 (90 BASE) MCG/ACT IN AERS: 1.0000 | INHALATION_SPRAY | Freq: Four times a day (QID) | RESPIRATORY_TRACT | 0 refills | Status: AC | PRN

## 2023-04-24 NOTE — ED Provider Notes (Signed)
[facility]    CSN: [id number] Arrival date & time: 04/24/23  4098      History   Chief Complaint Chief Complaint  Patient presents with   Fever   Cough    HPI Gregory Haley is a 67 y.o. male presenting for 3-day history of temps up to 100.3 degrees with fatigue, productive cough, congestion, chest pain, shortness of breath.  Denies sore throat, sinus pain or nasal congestion.  Wife was sick a couple weeks ago. Son currently ill.  No known COVID exposure.  Patient has been taking multiple over-the-counter cough meds without relief.  Has also taken Advil this morning.  No history of lung disease.  HPI  Past Medical History:  Diagnosis Date   Cancer [facility])    prostate    There are no problems to display for this patient.   Past Surgical History:  Procedure Laterality Date   ANKLE SURGERY Right    x 7   APPENDECTOMY     BACK SURGERY     EYE SURGERY     HERNIA REPAIR     INNER EAR SURGERY     KIDNEY SURGERY     PROSTATE SURGERY     VASECTOMY         Home Medications    Prior to Admission medications   Medication Sig Start Date End Date Taking? Authorizing Provider  albuterol (VENTOLIN HFA) 108 (90 Base) MCG/ACT inhaler Inhale 1-2 puffs into the lungs every 6 (six) hours as needed for wheezing or shortness of breath. 04/24/23  Yes Gregory Friendly B, PA-C  amoxicillin-clavulanate (AUGMENTIN) 875-125 MG tablet Take 1 tablet by mouth every 12 (twelve) hours for 7 days. 04/24/23 05/01/23 Yes Gregory Latch, PA-C  azithromycin (ZITHROMAX) 250 MG tablet Take 1 tablet (250 mg total) by mouth daily. Take first 2 tablets together, then 1 every day until finished. 04/24/23  Yes Gregory Friendly B, PA-C  Multiple Vitamin (MULTI-VITAMIN) tablet Take 1 tablet by mouth daily.   Yes [provider]  promethazine-dextromethorphan (PROMETHAZINE-DM) 6.25-15 MG/5ML syrup Take 5 mLs by mouth 4 (four) times daily as needed. 04/24/23  Yes Gregory Friendly B, PA-C  rosuvastatin  (CRESTOR) 40 MG tablet Take by mouth. 03/10/22 04/24/23 Yes [provider]  benzonatate (TESSALON) 200 MG capsule Take 1 capsule (200 mg total) by mouth 3 (three) times daily as needed for cough. 05/17/22   Rodriguez-Southworth, Nettie Elm, PA-C  ipratropium (ATROVENT) 0.06 % nasal spray Place 2 sprays into both nostrils 4 (four) times daily. 03/27/23   Gregory Latch, PA-C  metFORMIN (GLUCOPHAGE-XR) 500 MG 24 hr tablet Take by mouth. 02/28/22 02/28/23  [provider]    Family History No family history on file.  Social History Social History   Tobacco Use   Smoking status: Former   Smokeless tobacco: Never  Advertising account planner   Vaping status: Never Used  Substance Use Topics   Alcohol use: Yes    Comment: occ   Drug use: No     Allergies   Bee venom, Microplegia msa-msg [cardioplegia del nido formula], Monosodium glutamate, and Plegisol   Review of Systems Review of Systems  Constitutional:  Positive for fatigue and fever.  HENT:  Positive for congestion. Negative for rhinorrhea, sinus pressure, sinus pain and sore throat.   Respiratory:  Positive for cough, shortness of breath and wheezing.   Cardiovascular:  Positive for chest pain.  Gastrointestinal:  Negative for abdominal pain, diarrhea, nausea and vomiting.  Musculoskeletal:  Positive  for myalgias.  Neurological:  Negative for weakness, light-headedness and headaches.  Hematological:  Negative for adenopathy.     Physical Exam Triage Vital Signs  No data found.  Updated Vital Signs BP (!) 135/91 (BP Location: Right Arm)   Pulse 97   Temp 98.7 F (37.1 C) (Oral)   SpO2 92%    Physical Exam Vitals and nursing note reviewed.  Constitutional:      General: He is not in acute distress.    Appearance: Normal appearance. He is well-developed. He is obese. He is ill-appearing.  HENT:     Head: Normocephalic and atraumatic.     Nose: No congestion.     Mouth/Throat:     Mouth: Mucous membranes are  moist.     Pharynx: Oropharynx is clear. No posterior oropharyngeal erythema.  Eyes:     General: No scleral icterus.    Conjunctiva/sclera: Conjunctivae normal.  Cardiovascular:     Rate and Rhythm: Normal rate and regular rhythm.     Heart sounds: Normal heart sounds.  Pulmonary:     Effort: Pulmonary effort is normal. No respiratory distress.     Breath sounds: Wheezing and rhonchi present.  Musculoskeletal:     Cervical back: Neck supple.  Skin:    General: Skin is warm and dry.     Capillary Refill: Capillary refill takes less than 2 seconds.  Neurological:     General: No focal deficit present.     Mental Status: He is alert. Mental status is at baseline.     Motor: No weakness.     Gait: Gait normal.  Psychiatric:        Mood and Affect: Mood normal.        Behavior: Behavior normal.      UC Treatments / Results  Labs (all labs ordered are listed, but only abnormal results are displayed) Labs Reviewed  SARS CORONAVIRUS 2 BY RT PCR     EKG   Radiology DG Chest 2 View  Result Date: 04/24/2023 CLINICAL DATA:  Cough and chest congestion.  Body aches. EXAM: CHEST - 2 VIEW COMPARISON:  08/24/2020 FINDINGS: Patchy airspace densities at the left lung base. Remainder of the lungs are clear. No large pleural effusions. Heart and mediastinum are within normal limits. Trachea is midline. No acute bone abnormality. IMPRESSION: Patchy airspace densities at the left lung base. Findings are concerning for pneumonia. Electronically Signed   By: Richarda Overlie M.D.   On: 04/24/2023 10:48    Procedures Procedures (including critical care time)  Medications Ordered in UC Medications  albuterol (PROVENTIL) (2.5 MG/3ML) 0.083% nebulizer solution 2.5 mg (2.5 mg Nebulization Given 04/24/23 1117)  dexamethasone (DECADRON) injection 10 mg (10 mg Intramuscular Given 04/24/23 1116)    Initial Impression / Assessment and Plan / UC Course  I have reviewed the triage vital signs and the  nursing notes.  Pertinent labs & imaging results that were available during my care of the patient were reviewed by me and considered in my medical decision making (see chart for details).   67 y/o male presents for fever, fatigue, cough, congestion, SOB, chest pain, and aches x 3 days  Patient is afebrile. Ill appearing but non toxic. On exam a slight congestion, no posterior pharyngeal erythema.  Scattered wheezing and rhonchi throughout.  No respiratory distress.  Speaking in full sentences.  Oxygen is 92 to 93%.  COVID negative.  CXR obtained to assess for possible pneumonia. X-ray shows left lower lobe pneumonia.  Patient given albuterol neb and 10 mg IM dexamethasone for wheezing and SOB.  Reviewed results. Supportive care encouraged with increasing rest and fluids.  Sent Promethazine DM, Augmentin and azithromycin to pharmacy.  Also advised Tylenol, throat lozenges, Chloraseptic spray.  Explained that symptoms should get better in the next 1 to 2 weeks.  Should be breaking the fever in the next couple of days.  Reviewed return and ED precautions.  Acute illness with systemic symptoms.  Final Clinical Impressions(s) / UC Diagnoses   Final diagnoses:  Pneumonia of left lower lobe due to infectious organism  Acute cough  Fever, unspecified     Discharge Instructions      -You were negative for COVID -You have pneumonia.  I sent antibiotics to the pharmacy. - We gave you a breathing treatment and a steroid injection in the clinic for your wheezing.  I also sent an inhaler to the pharmacy and cough medicine. - Repeat this x-ray in 4 weeks to make sure the pneumonia has cleared up.  If you continue to have fevers after the next 3 days or symptoms are worsening or you show no improvement please go to the emergency department.      ED Prescriptions     Medication Sig Dispense Auth. Provider   promethazine-dextromethorphan (PROMETHAZINE-DM) 6.25-15 MG/5ML syrup Take 5 mLs by  mouth 4 (four) times daily as needed. 118 mL Gregory Friendly B, PA-C   amoxicillin-clavulanate (AUGMENTIN) 875-125 MG tablet Take 1 tablet by mouth every 12 (twelve) hours for 7 days. 14 tablet Gregory Friendly B, PA-C   azithromycin (ZITHROMAX) 250 MG tablet Take 1 tablet (250 mg total) by mouth daily. Take first 2 tablets together, then 1 every day until finished. 6 tablet Gregory Friendly B, PA-C   albuterol (VENTOLIN HFA) 108 (90 Base) MCG/ACT inhaler Inhale 1-2 puffs into the lungs every 6 (six) hours as needed for wheezing or shortness of breath. 1 g Gregory Latch, PA-C      PDMP not reviewed this encounter.      Gregory Latch, PA-C 04/24/23 1136

## 2023-04-24 NOTE — Discharge Instructions (Addendum)
-  You were negative for COVID -You have pneumonia.  I sent antibiotics to the pharmacy. - We gave you a breathing treatment and a steroid injection in the clinic for your wheezing.  I also sent an inhaler to the pharmacy and cough medicine. - Repeat this x-ray in 4 weeks to make sure the pneumonia has cleared up.  If you continue to have fevers after the next 3 days or symptoms are worsening or you show no improvement please go to the emergency department.

## 2023-04-24 NOTE — ED Triage Notes (Signed)
Pt c/o chest stopped up, body aches all over, cough onset Friday. Pt states his son was sick, pt has been taking nyquil for symptoms.

## 2023-04-26 ENCOUNTER — Emergency Department: Payer: BC Managed Care – PPO

## 2023-04-26 ENCOUNTER — Encounter: Payer: Self-pay | Admitting: *Deleted

## 2023-04-26 ENCOUNTER — Other Ambulatory Visit: Payer: Self-pay

## 2023-04-26 ENCOUNTER — Emergency Department
Admission: EM | Admit: 2023-04-26 | Discharge: 2023-04-26 | Disposition: A | Discharge status: Home / Self Care | Payer: BC Managed Care – PPO | Attending: Emergency Medicine | Admitting: Emergency Medicine

## 2023-04-26 DIAGNOSIS — R059 Cough, unspecified: Secondary | ICD-10-CM | POA: Diagnosis present

## 2023-04-26 DIAGNOSIS — J181 Lobar pneumonia, unspecified organism: Secondary | ICD-10-CM | POA: Diagnosis not present

## 2023-04-26 DIAGNOSIS — J9801 Acute bronchospasm: Secondary | ICD-10-CM | POA: Insufficient documentation

## 2023-04-26 DIAGNOSIS — Z8546 Personal history of malignant neoplasm of prostate: Secondary | ICD-10-CM | POA: Insufficient documentation

## 2023-04-26 DIAGNOSIS — J189 Pneumonia, unspecified organism: Secondary | ICD-10-CM

## 2023-04-26 LAB — CBC
HCT: 45.3 % (ref 39.0–52.0)
Hemoglobin: 15.1 g/dL (ref 13.0–17.0)
MCH: 28.1 pg (ref 26.0–34.0)
MCHC: 33.3 g/dL (ref 30.0–36.0)
MCV: 84.2 fL (ref 80.0–100.0)
Platelets: 246 10*3/uL (ref 150–400)
RBC: 5.38 MIL/uL (ref 4.22–5.81)
RDW: 14.1 % (ref 11.5–15.5)
WBC: 6.7 10*3/uL (ref 4.0–10.5)
nRBC: 0 % (ref 0.0–0.2)

## 2023-04-26 LAB — BASIC METABOLIC PANEL
Anion gap: 8 (ref 5–15)
BUN: 23 mg/dL (ref 8–23)
CO2: 26 mmol/L (ref 22–32)
Calcium: 9.6 mg/dL (ref 8.9–10.3)
Chloride: 105 mmol/L (ref 98–111)
Creatinine, Ser: 1.05 mg/dL (ref 0.61–1.24)
GFR, Estimated: >60 mL/min (ref >60)
Glucose, Bld: 128 mg/dL — ABNORMAL HIGH (ref 70–99)
Potassium: 3.9 mmol/L (ref 3.5–5.1)
Sodium: 139 mmol/L (ref 135–145)

## 2023-04-26 MED ORDER — PREDNISONE 20 MG PO TABS: 60.0000 mg | ORAL_TABLET | Freq: Every day | ORAL | 0 refills | Status: AC

## 2023-04-26 MED ORDER — IPRATROPIUM-ALBUTEROL 0.5-2.5 (3) MG/3ML IN SOLN
9.0000 mL | Freq: Once | RESPIRATORY_TRACT | Status: AC
  Administered 2023-04-26: 9 mL via RESPIRATORY_TRACT
  Filled 2023-04-26: qty 3

## 2023-04-26 MED ORDER — PREDNISONE 20 MG PO TABS
60.0000 mg | ORAL_TABLET | Freq: Once | ORAL | Status: AC
  Administered 2023-04-26: 60 mg via ORAL
  Filled 2023-04-26: qty 3

## 2023-04-26 MED ORDER — PREDNISONE 20 MG PO TABS: 60.0000 mg | ORAL_TABLET | Freq: Every day | ORAL | 0 refills | Status: DC

## 2023-04-26 NOTE — ED Provider Notes (Signed)
[facility] Provider Note    Event Date/Time   First MD Initiated Contact with Patient 04/26/23 1957     (approximate)   History   Chief Complaint Cough   HPI  Gregory Haley is a 67 y.o. male with past medical history of prostate cancer who presents to the ED complaining of cough.  Patient reports that he has been dealing with persistent cough productive of yellowish sputum for about the past 5 days.  He was initially seen at urgent care and prescribed antibiotics for pneumonia, also prescribed albuterol inhaler and was given a dose of steroids while at urgent care.  Patient reports that he continues to have a severe cough with some mild difficulty breathing, denies any pain in his chest or fevers.  He denies any smoking history, does not have COPD or asthma.     Physical Exam   Triage Vital Signs: ED Triage Vitals  Encounter Vitals Group     BP 04/26/23 1650 125/64     Systolic BP Percentile --      Diastolic BP Percentile --      Pulse Rate 04/26/23 1647 84     Resp 04/26/23 1647 (!) 22     Temp 04/26/23 1647 98.7 F (37.1 C)     Temp Source 04/26/23 1647 Oral     SpO2 04/26/23 1647 95 %     Weight 04/26/23 1644 280 lb (127 kg)     Height 04/26/23 1644 6' (1.829 m)     Head Circumference --      Peak Flow --      Pain Score 04/26/23 1644 7     Pain Loc --      Pain Education --      Exclude from Growth Chart --     Most recent vital signs: Vitals:   04/26/23 2151 04/26/23 2203  BP: 130/76   Pulse: 81   Resp: (!) 22   Temp:  97.6 F (36.4 C)  SpO2: 98%     Constitutional: Alert and oriented. Eyes: Conjunctivae are normal. Head: Atraumatic. Nose: No congestion/rhinnorhea. Mouth/Throat: Mucous membranes are moist.  Cardiovascular: Normal rate, regular rhythm. Grossly normal heart sounds.  2+ radial pulses bilaterally. Respiratory: Mild tachypnea with normal respiratory effort.  No retractions. Lungs with expiratory wheezing  throughout Gastrointestinal: Soft and nontender. No distention. Musculoskeletal: No lower extremity tenderness nor edema.  Neurologic:  Normal speech and language. No gross focal neurologic deficits are appreciated.    ED Results / Procedures / Treatments   Labs (all labs ordered are listed, but only abnormal results are displayed) Labs Reviewed  BASIC METABOLIC PANEL - Abnormal; Notable for the following components:      Result Value   Glucose, Bld 128 (*)    All other components within normal limits  CBC   RADIOLOGY Chest x-ray reviewed and interpreted by me with no infiltrate, edema, or effusion.  PROCEDURES:  Critical Care performed: No  Procedures   MEDICATIONS ORDERED IN ED: Medications  predniSONE (DELTASONE) tablet 60 mg (60 mg Oral Given 04/26/23 2135)  ipratropium-albuterol (DUONEB) 0.5-2.5 (3) MG/3ML nebulizer solution 9 mL (9 mLs Nebulization Given 04/26/23 2137)     IMPRESSION / MDM / ASSESSMENT AND PLAN / ED COURSE  I reviewed the triage vital signs and the nursing notes.  67 y.o. male with past medical history of prostate cancer who presents to the ED complaining of ongoing cough following recent diagnosis of pneumonia.  Patient's presentation is most consistent with acute presentation with potential threat to life or bodily function.  Differential diagnosis includes, but is not limited to, pneumonia, rhonchal spasm, bronchitis, sepsis.  Patient well-appearing and in no acute distress, vital signs remarkable for mild tachypnea but otherwise reassuring.  He does have significant expiratory wheezing on exam, was only given a one-time steroid dose with urgent care.  We will give dose of prednisone and DuoNeb's here in the ED, chest x-ray shows improving infiltrate in his left lower lobe and do not feel a change in antibiotics is required.  EKG shows no evidence arrhythmia or ischemia and I doubt ACS or PE.  Labs are reassuring with  no significant anemia, leukocytosis, electrolyte abnormality, or AKI.  Wheezing improved on reassessment, patient reports breathing and cough have improved.  Chest x-ray read by radiology as improving left lower lobe infiltrate, will continue previously prescribed antibiotic until done.  Patient prescribed course of steroids, counseled to continue using inhaler every 4-6 hours.  He was counseled to return to the ED for new or worsening symptoms, patient agrees with plan.      FINAL CLINICAL IMPRESSION(S) / ED DIAGNOSES   Final diagnoses:  Bronchospasm  Pneumonia of left lower lobe due to infectious organism     Rx / DC Orders   ED Discharge Orders          Ordered    predniSONE (DELTASONE) 20 MG tablet  Daily with breakfast        04/26/23 2208             Note:  This document was prepared using Dragon voice recognition software and may include unintentional dictation errors.   Chesley Noon, MD 04/26/23 2209

## 2023-04-26 NOTE — ED Triage Notes (Signed)
Pt reports he has pneumonia   dx 04-24-2023 at [facility] urgent care.  Pt taking meds without relief.   Pt alert  speech clear.

## 2023-07-07 ENCOUNTER — Ambulatory Visit: Payer: BC Managed Care – PPO | Admitting: Anesthesiology

## 2023-07-07 ENCOUNTER — Encounter: Payer: Self-pay | Admitting: *Deleted

## 2023-07-07 ENCOUNTER — Encounter
Admission: RE | Disposition: A | Discharge status: Home / Self Care | Payer: Self-pay | Source: Ambulatory Visit | Attending: Gastroenterology

## 2023-07-07 ENCOUNTER — Other Ambulatory Visit: Payer: Self-pay

## 2023-07-07 ENCOUNTER — Ambulatory Visit
Admission: RE | Admit: 2023-07-07 | Discharge: 2023-07-07 | Disposition: A | Discharge status: Home / Self Care | Payer: BC Managed Care – PPO | Source: Ambulatory Visit | Attending: Gastroenterology | Admitting: Gastroenterology

## 2023-07-07 DIAGNOSIS — Z8546 Personal history of malignant neoplasm of prostate: Secondary | ICD-10-CM | POA: Insufficient documentation

## 2023-07-07 DIAGNOSIS — K640 First degree hemorrhoids: Secondary | ICD-10-CM | POA: Diagnosis not present

## 2023-07-07 DIAGNOSIS — G4733 Obstructive sleep apnea (adult) (pediatric): Secondary | ICD-10-CM | POA: Insufficient documentation

## 2023-07-07 DIAGNOSIS — Z1211 Encounter for screening for malignant neoplasm of colon: Secondary | ICD-10-CM | POA: Diagnosis present

## 2023-07-07 DIAGNOSIS — Z9079 Acquired absence of other genital organ(s): Secondary | ICD-10-CM | POA: Insufficient documentation

## 2023-07-07 DIAGNOSIS — Z87891 Personal history of nicotine dependence: Secondary | ICD-10-CM | POA: Diagnosis not present

## 2023-07-07 DIAGNOSIS — Z6839 Body mass index (BMI) 39.0-39.9, adult: Secondary | ICD-10-CM | POA: Diagnosis not present

## 2023-07-07 DIAGNOSIS — Z923 Personal history of irradiation: Secondary | ICD-10-CM | POA: Diagnosis not present

## 2023-07-07 DIAGNOSIS — D122 Benign neoplasm of ascending colon: Secondary | ICD-10-CM | POA: Insufficient documentation

## 2023-07-07 DIAGNOSIS — E66813 Obesity, class 3: Secondary | ICD-10-CM | POA: Diagnosis not present

## 2023-07-07 DIAGNOSIS — E119 Type 2 diabetes mellitus without complications: Secondary | ICD-10-CM | POA: Insufficient documentation

## 2023-07-07 HISTORY — DX: Type 2 diabetes mellitus without complications: E11.9

## 2023-07-07 HISTORY — DX: Sleep apnea, unspecified: G47.30

## 2023-07-07 HISTORY — PX: POLYPECTOMY: SHX5525

## 2023-07-07 HISTORY — PX: COLONOSCOPY: SHX5424

## 2023-07-07 SURGERY — COLONOSCOPY
Anesthesia: General

## 2023-07-07 MED ORDER — PROPOFOL 10 MG/ML IV BOLUS
INTRAVENOUS | Status: AC
  Filled 2023-07-07: qty 40

## 2023-07-07 MED ORDER — SODIUM CHLORIDE 0.9 % IV SOLN: INTRAVENOUS | Status: DC

## 2023-07-07 MED ORDER — PROPOFOL 10 MG/ML IV BOLUS
INTRAVENOUS | Status: DC | PRN
Start: 2023-07-07 — End: 2023-07-07
  Administered 2023-07-07: 100 mg via INTRAVENOUS
  Administered 2023-07-07: 20 mg via INTRAVENOUS
  Administered 2023-07-07: 30 mg via INTRAVENOUS

## 2023-07-07 MED ORDER — DEXMEDETOMIDINE HCL IN NACL 80 MCG/20ML IV SOLN
INTRAVENOUS | Status: DC | PRN
  Administered 2023-07-07: 4 ug via INTRAVENOUS

## 2023-07-07 MED ORDER — GLYCOPYRROLATE 0.2 MG/ML IJ SOLN
INTRAMUSCULAR | Status: DC | PRN
  Administered 2023-07-07: .2 mg via INTRAVENOUS

## 2023-07-07 NOTE — Interval H&P Note (Signed)
History and Physical Interval Note:  07/07/2023 9:36 AM  Gregory Haley  has presented today for surgery, with the diagnosis of Colon cancer screening (Z12.11).  The various methods of treatment have been discussed with the patient and family. After consideration of risks, benefits and other options for treatment, the patient has consented to  Procedure(s): COLONOSCOPY (N/A) as a surgical intervention.  The patient's history has been reviewed, patient examined, no change in status, stable for surgery.  I have reviewed the patient's chart and labs.  Questions were answered to the patient's satisfaction.     Regis Bill  Ok to proceed with colonoscopy

## 2023-07-07 NOTE — Anesthesia Preprocedure Evaluation (Signed)
Anesthesia Evaluation  Patient identified by MRN, date of birth, ID band Patient awake    Reviewed: Allergy & Precautions, H&P , NPO status , Patient's Chart, lab work & pertinent test results, reviewed documented beta blocker date and time   History of Anesthesia Complications Negative for: history of anesthetic complications  Airway Mallampati: II  TM Distance: >3 FB Neck ROM: full    Dental  (+) Dental Advidsory Given, Teeth Intact, Missing   Pulmonary neg shortness of breath, sleep apnea and Continuous Positive Airway Pressure Ventilation , neg COPD, neg recent URI, former smoker   Pulmonary exam normal breath sounds clear to auscultation       Cardiovascular Exercise Tolerance: Good negative cardio ROS Normal cardiovascular exam Rhythm:regular Rate:Normal     Neuro/Psych negative neurological ROS  negative psych ROS   GI/Hepatic negative GI ROS, Neg liver ROS,,,  Endo/Other  neg diabetes  Class 3 obesity  Renal/GU negative Renal ROS  negative genitourinary   Musculoskeletal   Abdominal   Peds  Hematology negative hematology ROS (+)   Anesthesia Other Findings Past Medical History: No date: Cancer (HCC)     Comment:  prostate No date: Diabetes mellitus without complication (HCC) No date: Sleep apnea   Reproductive/Obstetrics negative OB ROS                             Anesthesia Physical Anesthesia Plan  ASA: 3  Anesthesia Plan: General   Post-op Pain Management:    Induction: Intravenous  PONV Risk Score and Plan: 2 and Propofol infusion and TIVA  Airway Management Planned: Natural Airway and Nasal Cannula  Additional Equipment:   Intra-op Plan:   Post-operative Plan:   Informed Consent: I have reviewed the patients History and Physical, chart, labs and discussed the procedure including the risks, benefits and alternatives for the proposed anesthesia with the  patient or authorized representative who has indicated his/her understanding and acceptance.     Dental Advisory Given  Plan Discussed with: Anesthesiologist, CRNA and Surgeon  Anesthesia Plan Comments:        Anesthesia Quick Evaluation

## 2023-07-07 NOTE — Transfer of Care (Signed)
Immediate Anesthesia Transfer of Care Note  Patient: Gregory Haley  Procedure(s) Performed: COLONOSCOPY POLYPECTOMY  Patient Location: PACU and Endoscopy Unit  Anesthesia Type:General  Level of Consciousness: awake  Airway & Oxygen Therapy: Patient Spontanous Breathing  Post-op Assessment: Report given to RN and Post -op Vital signs reviewed and stable  Post vital signs: Reviewed and stable  Last Vitals:  Vitals Value Taken Time  BP    Temp    Pulse    Resp    SpO2      Last Pain:  Vitals:   07/07/23 0931  TempSrc: Temporal         Complications: No notable events documented.

## 2023-07-07 NOTE — Op Note (Signed)
[facility] Patient Name: Gregory Haley Procedure Date: 07/07/2023 9:42 AM MRN: [id number] Account #: [id number] Date of Birth: 08/23/1955 Admit Type: Outpatient Age: 68 Room: [facility] ENDO ROOM 3 Gender: Male Note Status: Finalized Instrument Name: [name] [id number] Procedure:             Colonoscopy Indications:           Screening for colorectal malignant neoplasm Providers:             Eather Colas MD, MD Medicines:             Monitored Anesthesia Care Complications:         No immediate complications. Estimated blood loss:                         Minimal. Procedure:             Pre-Anesthesia Assessment:                        - Prior to the procedure, a History and Physical was                         performed, and patient medications and allergies were                         reviewed. The patient is competent. The risks and                         benefits of the procedure and the sedation options and                         risks were discussed with the patient. All questions                         were answered and informed consent was obtained.                         Patient identification and proposed procedure were                         verified by the physician, the nurse, the                         anesthesiologist, the anesthetist and the technician                         in the endoscopy suite. Mental Status Examination:                         alert and oriented. Airway Examination: normal                         oropharyngeal airway and neck mobility. Respiratory                         Examination: clear to auscultation. CV Examination:                         normal. Prophylactic Antibiotics: The patient does not  require prophylactic antibiotics. Prior                         Anticoagulants: The patient has taken no anticoagulant                         or antiplatelet agents. ASA Grade  Assessment: III - A                         patient with severe systemic disease. After reviewing                         the risks and benefits, the patient was deemed in                         satisfactory condition to undergo the procedure. The                         anesthesia plan was to use monitored anesthesia care                         (MAC). Immediately prior to administration of                         medications, the patient was re-assessed for adequacy                         to receive sedatives. The heart rate, respiratory                         rate, oxygen saturations, blood pressure, adequacy of                         pulmonary ventilation, and response to care were                         monitored throughout the procedure. The physical                         status of the patient was re-assessed after the                         procedure.                        After obtaining informed consent, the colonoscope was                         passed under direct vision. Throughout the procedure,                         the patient's blood pressure, pulse, and oxygen                         saturations were monitored continuously. The                         Colonoscope was introduced through the anus and  advanced to the the ileocecal valve. The colonoscopy                         was performed without difficulty. The patient                         tolerated the procedure well. The quality of the bowel                         preparation was poor. The ileocecal valve and the                         rectum were photographed. Findings:      The perianal and digital rectal examinations were normal.      A 2 mm polyp was found in the ascending colon. The polyp was sessile.       The polyp was removed with a cold snare. Resection and retrieval were       complete. Estimated blood loss was minimal.      A large amount of semi-solid stool was found  in the sigmoid colon, in       the descending colon, in the transverse colon, at the hepatic flexure       and in the cecum, precluding visualization.      Internal hemorrhoids were found during retroflexion. The hemorrhoids       were Grade I (internal hemorrhoids that do not prolapse). Impression:            - Preparation of the colon was poor.                        - One 2 mm polyp in the ascending colon, removed with                         a cold snare. Resected and retrieved.                        - Stool in the sigmoid colon, in the descending colon,                         in the transverse colon, at the hepatic flexure and in                         the cecum.                        - Internal hemorrhoids. Recommendation:        - Repeat colonoscopy in 6 months because the bowel                         preparation was suboptimal.                        - Return to referring physician as previously                         scheduled. Procedure Code(s):     --- Professional ---  16109, Colonoscopy, flexible; with removal of                         tumor(s), polyp(s), or other lesion(s) by snare                         technique Diagnosis Code(s):     --- Professional ---                        Z12.11, Encounter for screening for malignant neoplasm                         of colon                        K64.0, First degree hemorrhoids                        D12.2, Benign neoplasm of ascending colon CPT copyright 2022 American [employer]. All rights reserved. The codes documented in this report are preliminary and upon coder review may  be revised to meet current compliance requirements. Eather Colas MD, MD 07/07/2023 10:02:09 AM Number of Addenda: 0 Note Initiated On: 07/07/2023 9:42 AM Scope Withdrawal Time: 0 hours 4 minutes 8 seconds  Total Procedure Duration: 0 hours 8 minutes 27 seconds  Estimated Blood Loss:  Estimated blood loss was  minimal.      [facility]

## 2023-07-07 NOTE — Anesthesia Postprocedure Evaluation (Signed)
Anesthesia Post Note  Patient: Gregory Haley  Procedure(s) Performed: COLONOSCOPY POLYPECTOMY  Patient location during evaluation: Endoscopy Anesthesia Type: General Level of consciousness: awake and alert Pain management: pain level controlled Vital Signs Assessment: post-procedure vital signs reviewed and stable Respiratory status: spontaneous breathing, nonlabored ventilation, respiratory function stable and patient connected to nasal cannula oxygen Cardiovascular status: blood pressure returned to baseline and stable Postop Assessment: no apparent nausea or vomiting Anesthetic complications: no   No notable events documented.   Last Vitals:  Vitals:   07/07/23 1002 07/07/23 1003  BP: 113/67 113/67  Pulse: 82   Resp: 15   Temp: (!) 35.8 C   SpO2: 99%     Last Pain:  Vitals:   07/07/23 1002  TempSrc: Temporal  PainSc: 0-No pain                 Lenard Simmer

## 2023-07-07 NOTE — H&P (Signed)
Outpatient short stay form Pre-procedure 07/07/2023  Regis Bill, MD  Primary Physician: Titus Mould, NP  Reason for visit:  Screening  History of present illness:    68 y/o gentleman with history of obesity, OSA, and prostate cancer here for screening colonoscopy. Last colonoscopy 12 years ago was unremarkable. No blood thinners. No family history of GI malignancies. History of prostatectomy with radiation.    Current Facility-Administered Medications:    0.9 %  sodium chloride infusion, , Intravenous, Continuous, Deona Novitski, Rossie Muskrat, MD, Last Rate: 20 mL/hr at 07/07/23 0931, New Bag at 07/07/23 0931  Medications Prior to Admission  Medication Sig Dispense Refill Last Dose/Taking   Multiple Vitamin (MULTI-VITAMIN) tablet Take 1 tablet by mouth daily.   Past Week   albuterol (VENTOLIN HFA) 108 (90 Base) MCG/ACT inhaler Inhale 1-2 puffs into the lungs every 6 (six) hours as needed for wheezing or shortness of breath. 1 g 0    azithromycin (ZITHROMAX) 250 MG tablet Take 1 tablet (250 mg total) by mouth daily. Take first 2 tablets together, then 1 every day until finished. 6 tablet 0    benzonatate (TESSALON) 200 MG capsule Take 1 capsule (200 mg total) by mouth 3 (three) times daily as needed for cough. 30 capsule 0    ipratropium (ATROVENT) 0.06 % nasal spray Place 2 sprays into both nostrils 4 (four) times daily. 15 mL 0    metFORMIN (GLUCOPHAGE-XR) 500 MG 24 hr tablet Take by mouth.      promethazine-dextromethorphan (PROMETHAZINE-DM) 6.25-15 MG/5ML syrup Take 5 mLs by mouth 4 (four) times daily as needed. 118 mL 0    rosuvastatin (CRESTOR) 40 MG tablet Take by mouth.        Allergies  Allergen Reactions   Bee Venom Anaphylaxis   Microplegia Msa-Msg [Cardioplegia Del Nido Formula]    Monosodium Glutamate     Other reaction(s): Other (See Comments) "welts all over body"   Plegisol Other (See Comments)     Past Medical History:  Diagnosis Date   Cancer  (HCC)    prostate   Diabetes mellitus without complication (HCC)    Sleep apnea     Review of systems:  Otherwise negative.    Physical Exam  Gen: Alert, oriented. Appears stated age.  HEENT: PERRLA. Lungs: No respiratory distress CV: RRR Abd: soft, benign, no masses Ext: No edema    Planned procedures: Proceed with colonoscopy. The patient understands the nature of the planned procedure, indications, risks, alternatives and potential complications including but not limited to bleeding, infection, perforation, damage to internal organs and possible oversedation/side effects from anesthesia. The patient agrees and gives consent to proceed.  Please refer to procedure notes for findings, recommendations and patient disposition/instructions.     Regis Bill, MD [facility]

## 2023-07-10 ENCOUNTER — Encounter: Payer: Self-pay | Admitting: Gastroenterology

## 2023-07-10 LAB — SURGICAL PATHOLOGY

## 2023-11-01 ENCOUNTER — Ambulatory Visit
Admission: EM | Admit: 2023-11-01 | Discharge: 2023-11-01 | Disposition: A | Discharge status: Home / Self Care | Attending: Family Medicine | Admitting: Family Medicine

## 2023-11-01 ENCOUNTER — Ambulatory Visit
Admission: RE | Admit: 2023-11-01 | Discharge: 2023-11-01 | Disposition: A | Discharge status: Home / Self Care | Source: Ambulatory Visit | Attending: *Deleted | Admitting: *Deleted

## 2023-11-01 ENCOUNTER — Ambulatory Visit
Admission: RE | Admit: 2023-11-01 | Discharge: 2023-11-01 | Disposition: A | Discharge status: Home / Self Care | Source: Ambulatory Visit | Attending: Family Medicine | Admitting: Family Medicine

## 2023-11-01 DIAGNOSIS — N5089 Other specified disorders of the male genital organs: Secondary | ICD-10-CM

## 2023-11-01 DIAGNOSIS — N433 Hydrocele, unspecified: Secondary | ICD-10-CM | POA: Diagnosis not present

## 2023-11-01 DIAGNOSIS — N50812 Left testicular pain: Secondary | ICD-10-CM | POA: Diagnosis present

## 2023-11-01 MED ORDER — DOXYCYCLINE HYCLATE 100 MG PO CAPS: 100.0000 mg | ORAL_CAPSULE | Freq: Two times a day (BID) | ORAL | 0 refills | Status: DC

## 2023-11-01 NOTE — Discharge Instructions (Addendum)
 Go to the medical mall to have an ultrasound performed of your testicles. Look for signs at [facility] for the [facility]. Enter through the medical mall and go to the radiology department for your ultrasound.

## 2023-11-01 NOTE — ED Triage Notes (Signed)
 Testicle swelling x 2 days. Hx of prostate cancer.

## 2023-11-01 NOTE — ED Provider Notes (Signed)
 [facility]    CSN: [id number] Arrival date & time: 11/01/23  1800      History   Chief Complaint Chief Complaint  Patient presents with   Groin Swelling    HPI  HPI  Gregory Haley is a 68 y.o. male bilateral scrotal swelling that started a couple days ago.  Has pretty good swelling.  Pain rated 3/10.  Had similar sx a few years ago. No concern for STDs.  Only had intercourse with his wife. Has left sided testicular pain. Has bilateral  inguinal pain.    - Penile discharge: no  -Testicular pain: left sided  - Fever: no  - Abdominal pain: no  - Rash: no - Sore throat: no   - Arthralgias: not new - Nausea: no - Vomiting: no - Dysuria: no - Back Pain: no       Past Medical History:  Diagnosis Date   Cancer (HCC)    prostate   Diabetes mellitus without complication (HCC)    Sleep apnea     There are no active problems to display for this patient.   Past Surgical History:  Procedure Laterality Date   ANKLE SURGERY Right    x 7   APPENDECTOMY     BACK SURGERY     COLONOSCOPY     COLONOSCOPY N/A 07/07/2023   Procedure: COLONOSCOPY;  Surgeon: Maryruth Ole DASEN, MD;  Location: [facility] ENDOSCOPY;  Service: Endoscopy;  Laterality: N/A;   EYE SURGERY     HERNIA REPAIR     INNER EAR SURGERY     KIDNEY SURGERY     POLYPECTOMY  07/07/2023   Procedure: POLYPECTOMY;  Surgeon: Maryruth Ole DASEN, MD;  Location: [facility] ENDOSCOPY;  Service: Endoscopy;;   PROSTATE SURGERY     VASECTOMY         Home Medications    Prior to Admission medications   Medication Sig Start Date End Date Taking? Authorizing Provider  doxycycline  (VIBRAMYCIN ) 100 MG capsule Take 1 capsule (100 mg total) by mouth 2 (two) times daily. 11/01/23  Yes Krystena Reitter, DO  rosuvastatin (CRESTOR) 40 MG tablet Take by mouth. 03/10/22 11/01/23 Yes [provider]  albuterol  (VENTOLIN  HFA) 108 (90 Base) MCG/ACT inhaler Inhale 1-2 puffs into the lungs every 6 (six) hours as  needed for wheezing or shortness of breath. 04/24/23   Arvis Huxley B, PA-C  azithromycin  (ZITHROMAX ) 250 MG tablet Take 1 tablet (250 mg total) by mouth daily. Take first 2 tablets together, then 1 every day until finished. 04/24/23   Arvis Huxley NOVAK, PA-C  benzonatate  (TESSALON ) 200 MG capsule Take 1 capsule (200 mg total) by mouth 3 (three) times daily as needed for cough. 05/17/22   Rodriguez-Southworth, Sylvia, PA-C  ipratropium (ATROVENT ) 0.06 % nasal spray Place 2 sprays into both nostrils 4 (four) times daily. 03/27/23   Arvis Huxley NOVAK, PA-C  metFORMIN (GLUCOPHAGE-XR) 500 MG 24 hr tablet Take by mouth. 02/28/22 02/28/23  [provider]  Multiple Vitamin (MULTI-VITAMIN) tablet Take 1 tablet by mouth daily.    [provider]  promethazine -dextromethorphan (PROMETHAZINE -DM) 6.25-15 MG/5ML syrup Take 5 mLs by mouth 4 (four) times daily as needed. 04/24/23   Arvis Huxley NOVAK, PA-C    Family History History reviewed. No pertinent family history.  Social History Social History   Tobacco Use   Smoking status: Former   Smokeless tobacco: Never  Vaping Use   Vaping status: Never Used  Substance Use Topics   Alcohol use: Yes  Comment: occ   Drug use: No     Allergies   Bee venom, Microplegia msa-msg [cardioplegia del nido formula], Monosodium glutamate, and Plegisol   Review of Systems Review of Systems: negative unless otherwise stated in HPI.      Physical Exam Triage Vital Signs ED Triage Vitals  Encounter Vitals Group     BP 11/01/23 1818 124/83     Systolic BP Percentile --      Diastolic BP Percentile --      Pulse Rate 11/01/23 1818 88     Resp 11/01/23 1818 17     Temp 11/01/23 1818 98.1 F (36.7 C)     Temp Source 11/01/23 1818 Oral     SpO2 11/01/23 1818 94 %     Weight --      Height --      Head Circumference --      Peak Flow --      Pain Score 11/01/23 1817 4     Pain Loc --      Pain Education --      Exclude from Growth Chart --     No data found.  Updated Vital Signs BP 124/83 (BP Location: Right Arm)   Pulse 88   Temp 98.1 F (36.7 C) (Oral)   Resp 17   SpO2 94%   Visual Acuity Right Eye Distance:   Left Eye Distance:   Bilateral Distance:    Right Eye Near:   Left Eye Near:    Bilateral Near:     Physical Exam GEN: well appearing male in no acute distress  CVS: well perfused  RESP: speaking in full sentences without pause, no respiratory distress  GU:  normal penile shaft, buried penis, no urethral discharge, +scrotal edema, + left testicle tenderness but no epididymal tenderness, wife present for exam   UC Treatments / Results  Labs (all labs ordered are listed, but only abnormal results are displayed) Labs Reviewed - No data to display  EKG   Radiology US  SCROTUM W/DOPPLER Result Date: 11/01/2023 CLINICAL DATA:  Left-sided testicular pain and swelling, initial encounter EXAM: SCROTAL ULTRASOUND DOPPLER ULTRASOUND OF THE TESTICLES TECHNIQUE: Complete ultrasound examination of the testicles, epididymis, and other scrotal structures was performed. Color and spectral Doppler ultrasound were also utilized to evaluate blood flow to the testicles. COMPARISON:  None Available. FINDINGS: Right testicle Measurements: 4.0 x 2.0 x 2.6 cm. Scrotal pearl is noted. Microcalcifications are noted within the testicle. Left testicle Measurements: 3.3 x 2.2 x 2.5 cm. Microcalcifications are noted within the testicle. Right epididymis:  2 mm epididymal cyst is noted. Left epididymis: Multiple epididymal cysts are noted measuring up to 2.9 mm. Hydrocele: Bilateral hydroceles are seen. Septations are noted on the right indicating a more chronic process. Varicocele:  None visualized. Pulsed Doppler interrogation of both testes demonstrates normal low resistance arterial and venous waveforms bilaterally. IMPRESSION: Microcalcifications are noted bilaterally. No other testicular abnormality is seen. Bilateral epididymal cysts.  Bilateral hydroceles worse on the right than the left. Small scrotal pearl is noted on the right Electronically Signed   By: Oneil Devonshire M.D.   On: 11/01/2023 21:06     Procedures Procedures (including critical care time)  Medications Ordered in UC Medications - No data to display  Initial Impression / Assessment and Plan / UC Course  I have reviewed the triage vital signs and the nursing notes.  Pertinent labs & imaging results that were available during my care of the patient were  reviewed by me and considered in my medical decision making (see chart for details).     Pt is a 68 y.o. year old male who presents for testicular pain that started 2 days ago.  Overall, patient is well-appearing and in no acute distress.    Differential diagnosis includes testicular torsion, testicular mass/cancer, epididymitis, STI, varicocele, hydrocele, scrotal trauma, viral orchitis, inguinal hernia  On exam, he has left testicular tenderness but not near the epididymis. Gonorrhea  and chlamydia declined.  Given unilateral tenderness with scrotal edema, ordered STAT scrotal US  with Doppler. Pt to travel to [facility] for his ultrasound.  Treat with doxycycline .    Return precautions including abdominal pain, fever, chills, nausea, or vomiting given. ED precautions given and understanding voiced. He may need to see a urologist.      Final Clinical Impressions(s) / UC Diagnoses   Final diagnoses:  Testicular pain, left  Scrotal edema     Discharge Instructions      Go to the medical mall to have an ultrasound performed of your testicles. Look for signs at [facility] for the [facility]. Enter through the medical mall and go to the radiology department for your ultrasound.        ED Prescriptions     Medication Sig Dispense Auth. Provider   doxycycline  (VIBRAMYCIN ) 100 MG capsule Take 1 capsule (100 mg total) by mouth 2 (two) times daily. 14 capsule  Aidric Endicott, DO      PDMP not reviewed this encounter.   Shrika Milos, DO 11/12/23 0123

## 2023-11-02 ENCOUNTER — Ambulatory Visit (HOSPITAL_COMMUNITY): Payer: Self-pay

## 2024-01-05 ENCOUNTER — Encounter: Payer: Self-pay | Admitting: *Deleted

## 2024-01-12 ENCOUNTER — Ambulatory Visit: Admitting: Anesthesiology

## 2024-01-12 ENCOUNTER — Other Ambulatory Visit: Payer: Self-pay

## 2024-01-12 ENCOUNTER — Encounter
Admission: RE | Disposition: A | Discharge status: Home / Self Care | Payer: Self-pay | Source: Home / Self Care | Attending: Gastroenterology

## 2024-01-12 ENCOUNTER — Ambulatory Visit
Admission: RE | Admit: 2024-01-12 | Discharge: 2024-01-12 | Disposition: A | Discharge status: Home / Self Care | Attending: Gastroenterology | Admitting: Gastroenterology

## 2024-01-12 DIAGNOSIS — Z6841 Body Mass Index (BMI) 40.0 and over, adult: Secondary | ICD-10-CM | POA: Insufficient documentation

## 2024-01-12 DIAGNOSIS — K640 First degree hemorrhoids: Secondary | ICD-10-CM | POA: Diagnosis not present

## 2024-01-12 DIAGNOSIS — Z7984 Long term (current) use of oral hypoglycemic drugs: Secondary | ICD-10-CM | POA: Insufficient documentation

## 2024-01-12 DIAGNOSIS — Z923 Personal history of irradiation: Secondary | ICD-10-CM | POA: Diagnosis not present

## 2024-01-12 DIAGNOSIS — G4733 Obstructive sleep apnea (adult) (pediatric): Secondary | ICD-10-CM | POA: Diagnosis not present

## 2024-01-12 DIAGNOSIS — Z09 Encounter for follow-up examination after completed treatment for conditions other than malignant neoplasm: Secondary | ICD-10-CM | POA: Diagnosis present

## 2024-01-12 DIAGNOSIS — Z8546 Personal history of malignant neoplasm of prostate: Secondary | ICD-10-CM | POA: Insufficient documentation

## 2024-01-12 DIAGNOSIS — K635 Polyp of colon: Secondary | ICD-10-CM | POA: Diagnosis not present

## 2024-01-12 DIAGNOSIS — D125 Benign neoplasm of sigmoid colon: Secondary | ICD-10-CM | POA: Diagnosis not present

## 2024-01-12 DIAGNOSIS — E6689 Other obesity not elsewhere classified: Secondary | ICD-10-CM | POA: Diagnosis not present

## 2024-01-12 DIAGNOSIS — Z87891 Personal history of nicotine dependence: Secondary | ICD-10-CM | POA: Insufficient documentation

## 2024-01-12 HISTORY — DX: Hyperlipidemia, unspecified: E78.5

## 2024-01-12 HISTORY — DX: Male erectile dysfunction, unspecified: N52.9

## 2024-01-12 HISTORY — PX: POLYPECTOMY: SHX149

## 2024-01-12 HISTORY — PX: COLONOSCOPY: SHX5424

## 2024-01-12 HISTORY — DX: Malignant neoplasm of prostate: C61

## 2024-01-12 HISTORY — DX: Personal history of other malignant neoplasm of kidney: Z85.528

## 2024-01-12 LAB — GLUCOSE, CAPILLARY: Glucose-Capillary: 154 mg/dL — ABNORMAL HIGH (ref 70–99)

## 2024-01-12 SURGERY — COLONOSCOPY
Anesthesia: General

## 2024-01-12 MED ORDER — PROPOFOL 10 MG/ML IV BOLUS
INTRAVENOUS | Status: DC | PRN
  Administered 2024-01-12: 30 mg via INTRAVENOUS
  Administered 2024-01-12: 80 mg via INTRAVENOUS
  Administered 2024-01-12: 20 mg via INTRAVENOUS

## 2024-01-12 MED ORDER — PROPOFOL 500 MG/50ML IV EMUL
INTRAVENOUS | Status: DC | PRN
Start: 2024-01-12 — End: 2024-01-12
  Administered 2024-01-12: 140 ug/kg/min via INTRAVENOUS

## 2024-01-12 MED ORDER — SODIUM CHLORIDE 0.9 % IV SOLN: INTRAVENOUS | Status: DC

## 2024-01-12 NOTE — Anesthesia Postprocedure Evaluation (Signed)
 Anesthesia Post Note  Patient: Gregory Haley  Procedure(s) Performed: COLONOSCOPY POLYPECTOMY, INTESTINE  Patient location during evaluation: PACU Anesthesia Type: General Level of consciousness: awake Pain management: pain level controlled Vital Signs Assessment: post-procedure vital signs reviewed and stable Respiratory status: spontaneous breathing and nonlabored ventilation Cardiovascular status: stable Anesthetic complications: no   No notable events documented.   Last Vitals:  Vitals:   01/12/24 0920 01/12/24 0930  BP: 101/64 127/79  Pulse: 86 80  Resp: 18 17  Temp:    SpO2: 96% 99%    Last Pain:  Vitals:   01/12/24 0930  TempSrc:   PainSc: 0-No pain                 VAN STAVEREN,Rudie Sermons

## 2024-01-12 NOTE — Transfer of Care (Signed)
 Immediate Anesthesia Transfer of Care Note  Patient: Gregory Haley  Procedure(s) Performed: COLONOSCOPY POLYPECTOMY, INTESTINE  Patient Location: PACU  Anesthesia Type:General  Level of Consciousness: awake, alert , and oriented  Airway & Oxygen Therapy: Patient Spontanous Breathing  Post-op Assessment: Report given to RN and Post -op Vital signs reviewed and stable  Post vital signs: Reviewed and stable  Last Vitals:  Vitals Value Taken Time  BP 101/64 01/12/24 09:20  Temp    Pulse 84 01/12/24 09:21  Resp 14 01/12/24 09:22  SpO2 96 % 01/12/24 09:21  Vitals shown include unfiled device data.  Last Pain:  Vitals:   01/12/24 0920  TempSrc:   PainSc: Asleep         Complications: No notable events documented.

## 2024-01-12 NOTE — H&P (Signed)
 Outpatient short stay form Pre-procedure 01/12/2024  Gregory ONEIDA Schick, MD  Primary Physician: Teresa Almarie Nam, NP  Reason for visit:  Surveillance  History of present illness:    68 y/o gentleman with history of obesity, OSA, and prostate cancer here for colonoscopy. Last colonoscopy 6 months ago with poor prep. No blood thinners. No family history of GI malignancies. History of prostatectomy with radiation.     Current Facility-Administered Medications:    0.9 %  sodium chloride  infusion, , Intravenous, Continuous, Layal Javid, Gregory ONEIDA, MD, Last Rate: 20 mL/hr at 01/12/24 0808, New Bag at 01/12/24 0808  Medications Prior to Admission  Medication Sig Dispense Refill Last Dose/Taking   albuterol  (VENTOLIN  HFA) 108 (90 Base) MCG/ACT inhaler Inhale 1-2 puffs into the lungs every 6 (six) hours as needed for wheezing or shortness of breath. 1 g 0 Past Week   azithromycin  (ZITHROMAX ) 250 MG tablet Take 1 tablet (250 mg total) by mouth daily. Take first 2 tablets together, then 1 every day until finished. 6 tablet 0 Past Week   benzonatate  (TESSALON ) 200 MG capsule Take 1 capsule (200 mg total) by mouth 3 (three) times daily as needed for cough. 30 capsule 0 Past Week   doxycycline  (VIBRAMYCIN ) 100 MG capsule Take 1 capsule (100 mg total) by mouth 2 (two) times daily. 14 capsule 0 Past Week   metFORMIN (GLUCOPHAGE-XR) 500 MG 24 hr tablet Take by mouth.   Past Week   Multiple Vitamin (MULTI-VITAMIN) tablet Take 1 tablet by mouth daily.   Past Week   promethazine -dextromethorphan (PROMETHAZINE -DM) 6.25-15 MG/5ML syrup Take 5 mLs by mouth 4 (four) times daily as needed. 118 mL 0 Past Week   rosuvastatin (CRESTOR) 40 MG tablet Take by mouth.   Past Week   ipratropium (ATROVENT ) 0.06 % nasal spray Place 2 sprays into both nostrils 4 (four) times daily. 15 mL 0      Allergies  Allergen Reactions   Bee Venom Anaphylaxis   Microplegia Msa-Msg [Cardioplegia Del Nido Formula]    Monosodium  Glutamate     Other reaction(s): Other (See Comments) welts all over body   Plegisol Other (See Comments)     Past Medical History:  Diagnosis Date   Cancer (HCC)    prostate   Diabetes mellitus without complication (HCC)    ED (erectile dysfunction)    History of renal cell cancer    Hyperlipidemia    Prostate cancer (HCC)    Sleep apnea     Review of systems:  Otherwise negative.    Physical Exam  Gen: Alert, oriented. Appears stated age.  HEENT: PERRLA. Lungs: No respiratory distress CV: RRR Abd: soft, benign, no masses Ext: No edema   Planned procedures: Proceed with colonoscopy. The patient understands the nature of the planned procedure, indications, risks, alternatives and potential complications including but not limited to bleeding, infection, perforation, damage to internal organs and possible oversedation/side effects from anesthesia. The patient agrees and gives consent to proceed.  Please refer to procedure notes for findings, recommendations and patient disposition/instructions.     Gregory ONEIDA Schick, MD [facility]

## 2024-01-12 NOTE — Interval H&P Note (Signed)
 History and Physical Interval Note:  01/12/2024 8:42 AM  Gregory Haley  has presented today for surgery, with the diagnosis of PH Colon Polyps.  The various methods of treatment have been discussed with the patient and family. After consideration of risks, benefits and other options for treatment, the patient has consented to  Procedure(s): COLONOSCOPY (N/A) as a surgical intervention.  The patient's history has been reviewed, patient examined, no change in status, stable for surgery.  I have reviewed the patient's chart and labs.  Questions were answered to the patient's satisfaction.     [name]  Ok to proceed with colonoscopy

## 2024-01-12 NOTE — Op Note (Signed)
 [facility] Patient Name: Gregory Haley Procedure Date: 01/12/2024 8:41 AM MRN: [id number] Account #: [id number] Date of Birth: 1955-06-25 Admit Type: Outpatient Age: 68 Room: [facility] ENDO ROOM 3 Gender: Male Note Status: Finalized Instrument Name: Colon Scope [id number] Procedure:             Colonoscopy Indications:           Surveillance: Personal history of adenomatous polyps,                         inadequate prep on last colonoscopy (less than 1 year                         ago) Providers:             Ole Schick MD, MD Referring MD:          Almarie Brantley Pizza (Referring MD) Medicines:             Monitored Anesthesia Care Complications:         No immediate complications. Estimated blood loss:                         Minimal. Procedure:             Pre-Anesthesia Assessment:                        - Prior to the procedure, a History and Physical was                         performed, and patient medications and allergies were                         reviewed. The patient is competent. The risks and                         benefits of the procedure and the sedation options and                         risks were discussed with the patient. All questions                         were answered and informed consent was obtained.                         Patient identification and proposed procedure were                         verified by the physician, the nurse, the                         anesthesiologist, the anesthetist and the technician                         in the endoscopy suite. Mental Status Examination:                         alert and oriented. Airway Examination: normal  oropharyngeal airway and neck mobility. Respiratory                         Examination: clear to auscultation. CV Examination:                         normal. Prophylactic Antibiotics: The patient does not                          require prophylactic antibiotics. Prior                         Anticoagulants: The patient has taken no anticoagulant                         or antiplatelet agents. ASA Grade Assessment: III - A                         patient with severe systemic disease. After reviewing                         the risks and benefits, the patient was deemed in                         satisfactory condition to undergo the procedure. The                         anesthesia plan was to use monitored anesthesia care                         (MAC). Immediately prior to administration of                         medications, the patient was re-assessed for adequacy                         to receive sedatives. The heart rate, respiratory                         rate, oxygen saturations, blood pressure, adequacy of                         pulmonary ventilation, and response to care were                         monitored throughout the procedure. The physical                         status of the patient was re-assessed after the                         procedure.                        After obtaining informed consent, the colonoscope was                         passed under direct vision. Throughout the procedure,  the patient's blood pressure, pulse, and oxygen                         saturations were monitored continuously. The                         Colonoscope was introduced through the anus and                         advanced to the the cecum, identified by appendiceal                         orifice and ileocecal valve. The colonoscopy was                         performed without difficulty. The patient tolerated                         the procedure well. The quality of the bowel                         preparation was good. The ileocecal valve, appendiceal                         orifice, and rectum were photographed. Findings:      The perianal and digital rectal examinations  were normal.      Three sessile polyps were found in the sigmoid colon. The polyps were 2       to 4 mm in size. These polyps were removed with a cold snare. Resection       and retrieval were complete. Estimated blood loss was minimal.      Internal hemorrhoids were found during retroflexion. The hemorrhoids       were Grade I (internal hemorrhoids that do not prolapse).      The exam was otherwise without abnormality on direct and retroflexion       views. Impression:            - Three 2 to 4 mm polyps in the sigmoid colon, removed                         with a cold snare. Resected and retrieved.                        - Internal hemorrhoids.                        - The examination was otherwise normal on direct and                         retroflexion views. Recommendation:        - Discharge patient to home.                        - Resume previous diet.                        - Continue present medications.                        -  Await pathology results.                        - Repeat colonoscopy for surveillance based on                         pathology results.                        - Return to referring physician as previously                         scheduled. Procedure Code(s):     --- Professional ---                        [phone], Colonoscopy, flexible; with removal of                         tumor(s), polyp(s), or other lesion(s) by snare                         technique Diagnosis Code(s):     --- Professional ---                        Z86.010, Personal history of colonic polyps                        D12.5, Benign neoplasm of sigmoid colon                        K64.0, First degree hemorrhoids CPT copyright 2022 American Medical Association. All rights reserved. The codes documented in this report are preliminary and upon coder review may  be revised to meet current compliance requirements. Ole Schick MD, MD 01/12/2024 9:24:41 AM Number of Addenda:  0 Note Initiated On: 01/12/2024 8:41 AM Scope Withdrawal Time: 0 hours 17 minutes 13 seconds  Total Procedure Duration: 0 hours 23 minutes 54 seconds  Estimated Blood Loss:  Estimated blood loss was minimal.      [facility]

## 2024-01-12 NOTE — Anesthesia Preprocedure Evaluation (Addendum)
 Anesthesia Evaluation  Patient identified by MRN, date of birth, ID band Patient awake    Reviewed: Allergy & Precautions, NPO status , Patient's Chart, lab work & pertinent test results  Airway Mallampati: III  TM Distance: >3 FB Neck ROM: full    Dental  (+) Teeth Intact   Pulmonary neg pulmonary ROS, sleep apnea and Continuous Positive Airway Pressure Ventilation , Patient abstained from smoking., former smoker   Pulmonary exam normal  + decreased breath sounds      Cardiovascular Exercise Tolerance: Good negative cardio ROS Normal cardiovascular exam Rhythm:Regular Rate:Normal     Neuro/Psych negative neurological ROS  negative psych ROS   GI/Hepatic negative GI ROS, Neg liver ROS,,,  Endo/Other  negative endocrine ROSdiabetes, Type 2, Oral Hypoglycemic Agents  Class 4 obesity  Renal/GU negative Renal ROS  negative genitourinary   Musculoskeletal   Abdominal  (+) + obese  Peds negative pediatric ROS (+)  Hematology negative hematology ROS (+)   Anesthesia Other Findings Past Medical History: No date: Cancer (HCC)     Comment:  prostate No date: Diabetes mellitus without complication (HCC) No date: ED (erectile dysfunction) No date: History of renal cell cancer No date: Hyperlipidemia No date: Prostate cancer (HCC) No date: Sleep apnea  Past Surgical History: No date: ANKLE SURGERY; Right     Comment:  x 7 No date: APPENDECTOMY No date: BACK SURGERY No date: COLONOSCOPY 07/07/2023: COLONOSCOPY; N/A     Comment:  Procedure: COLONOSCOPY;  Surgeon: Maryruth Ole DASEN,               MD;  Location: [facility] ENDOSCOPY;  Service: Endoscopy;                Laterality: N/A; No date: EYE SURGERY No date: HERNIA REPAIR No date: INNER EAR SURGERY No date: KIDNEY SURGERY No date: NEPHRECTOMY; Right 07/07/2023: POLYPECTOMY     Comment:  Procedure: POLYPECTOMY;  Surgeon: Maryruth Ole DASEN,               MD;   Location: [facility] ENDOSCOPY;  Service: Endoscopy;; No date: PROSTATE SURGERY No date: TONSILLECTOMY No date: VASECTOMY  BMI    Body Mass Index: 40.17 kg/m      Reproductive/Obstetrics negative OB ROS                              Anesthesia Physical Anesthesia Plan  ASA: 3  Anesthesia Plan: General   Post-op Pain Management:    Induction: Intravenous  PONV Risk Score and Plan: Propofol  infusion and TIVA  Airway Management Planned: Natural Airway and Nasal Cannula  Additional Equipment:   Intra-op Plan:   Post-operative Plan:   Informed Consent: I have reviewed the patients History and Physical, chart, labs and discussed the procedure including the risks, benefits and alternatives for the proposed anesthesia with the patient or authorized representative who has indicated his/her understanding and acceptance.     Dental Advisory Given  Plan Discussed with: CRNA  Anesthesia Plan Comments:         Anesthesia Quick Evaluation

## 2024-01-15 LAB — SURGICAL PATHOLOGY

## 2024-04-21 ENCOUNTER — Ambulatory Visit
Admission: EM | Admit: 2024-04-21 | Discharge: 2024-04-21 | Disposition: A | Discharge status: Home / Self Care | Attending: Emergency Medicine | Admitting: Emergency Medicine

## 2024-04-21 ENCOUNTER — Ambulatory Visit

## 2024-04-21 ENCOUNTER — Ambulatory Visit (INDEPENDENT_AMBULATORY_CARE_PROVIDER_SITE_OTHER)

## 2024-04-21 DIAGNOSIS — M25571 Pain in right ankle and joints of right foot: Secondary | ICD-10-CM

## 2024-04-21 DIAGNOSIS — G8929 Other chronic pain: Secondary | ICD-10-CM

## 2024-04-21 MED ORDER — MELOXICAM 15 MG PO TABS: 15.0000 mg | ORAL_TABLET | Freq: Every day | ORAL | 0 refills | Status: AC

## 2024-04-21 NOTE — Discharge Instructions (Addendum)
 Your x-rays do not show any evidence of broken or dislocated bones.  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  Take the meloxicam  once daily to help with pain and inflammation.  Keep your right foot elevated is much as possible to help with pain and inflammation.  If your pain persists I would recommend you follow-up with orthopedics as you may need a targeted joint injection or you may need dedicated physical therapy to help your symptoms.

## 2024-04-21 NOTE — ED Provider Notes (Signed)
 [facility]    CSN: [id number] Arrival date & time: 04/21/24  1014      History   Chief Complaint Chief Complaint  Patient presents with   Ankle Pain    HPI Gregory Haley is a 68 y.o. male.   HPI  68 year old male with past medical history significant for prostate cancer, diabetes, ED, renal cell carcinoma, hyperlipidemia, sleep apnea presents for evaluation of pain and swelling to the right ankle.  He reports that he bent down and heard a pop in his ankle 2 months ago and he has been experiencing pain and swelling ever since.  Numbness at the site at the site of pain but not distally.  Past Medical History:  Diagnosis Date   Cancer [facility])    prostate   Diabetes mellitus without complication [facility])    ED (erectile dysfunction)    History of renal cell cancer    Hyperlipidemia    Prostate cancer (HCC)    Sleep apnea     There are no active problems to display for this patient.   Past Surgical History:  Procedure Laterality Date   ANKLE SURGERY Right    x 7   APPENDECTOMY     BACK SURGERY     COLONOSCOPY     COLONOSCOPY N/A 07/07/2023   Procedure: COLONOSCOPY;  Surgeon: Maryruth Ole DASEN, MD;  Location: [facility] ENDOSCOPY;  Service: Endoscopy;  Laterality: N/A;   COLONOSCOPY N/A 01/12/2024   Procedure: COLONOSCOPY;  Surgeon: Maryruth Ole DASEN, MD;  Location: [facility] ENDOSCOPY;  Service: Endoscopy;  Laterality: N/A;   EYE SURGERY     HERNIA REPAIR     INNER EAR SURGERY     KIDNEY SURGERY     NEPHRECTOMY Right    POLYPECTOMY  07/07/2023   Procedure: POLYPECTOMY;  Surgeon: Maryruth Ole DASEN, MD;  Location: [facility] ENDOSCOPY;  Service: Endoscopy;;   POLYPECTOMY  01/12/2024   Procedure: POLYPECTOMY, INTESTINE;  Surgeon: Maryruth Ole DASEN, MD;  Location: [facility] ENDOSCOPY;  Service: Endoscopy;;   PROSTATE SURGERY     TONSILLECTOMY     VASECTOMY         Home Medications    Prior to Admission medications   Medication Sig Start Date End Date Taking?  Authorizing Provider  meloxicam  (MOBIC ) 15 MG tablet Take 1 tablet (15 mg total) by mouth daily. 04/21/24  Yes Bernardino Ditch, NP  Multiple Vitamin (MULTI-VITAMIN) tablet Take 1 tablet by mouth daily.   Yes [provider]  albuterol  (VENTOLIN  HFA) 108 (90 Base) MCG/ACT inhaler Inhale 1-2 puffs into the lungs every 6 (six) hours as needed for wheezing or shortness of breath. 04/24/23   Arvis Jolan NOVAK, PA-C  metFORMIN (GLUCOPHAGE-XR) 500 MG 24 hr tablet Take by mouth. 02/28/22 01/12/24  [provider]  rosuvastatin (CRESTOR) 40 MG tablet Take by mouth. 03/10/22 01/12/24  [provider]    Family History History reviewed. No pertinent family history.  Social History Social History   Tobacco Use   Smoking status: Former   Smokeless tobacco: Never  [employer]vertising [employer]   Vaping status: Never Used  Substance Use Topics   Alcohol use: Yes    Comment: occ   Drug use: No     Allergies   Bee venom, Microplegia msa-msg [cardioplegia del nido formula], Monosodium glutamate, and Plegisol   Review of Systems Review of Systems  Musculoskeletal:  Positive for arthralgias and joint swelling.  Skin:  Negative for color change.  Neurological:  Positive for numbness. Negative  for weakness.     Physical Exam Triage Vital Signs ED Triage Vitals  Encounter Vitals Group     BP      Girls Systolic BP Percentile      Girls Diastolic BP Percentile      Boys Systolic BP Percentile      Boys Diastolic BP Percentile      Pulse      Resp      Temp      Temp src      SpO2      Weight      Height      Head Circumference      Peak Flow      Pain Score      Pain Loc      Pain Education      Exclude from Growth Chart    No data found.  Updated Vital Signs BP (!) 145/92 (BP Location: Left Arm)   Pulse 73   Temp 97.7 F (36.5 C) (Oral)   Resp 19   Wt 270 lb (122.5 kg)   SpO2 96%   BMI 37.66 kg/m   Visual Acuity Right Eye Distance:   Left Eye Distance:   Bilateral  Distance:    Right Eye Near:   Left Eye Near:    Bilateral Near:     Physical Exam Vitals and nursing note reviewed.  Constitutional:      Appearance: Normal appearance. He is not ill-appearing.  HENT:     Head: Normocephalic and atraumatic.  Musculoskeletal:        General: Swelling and tenderness present. No signs of injury.  Skin:    General: Skin is warm and dry.     Capillary Refill: Capillary refill takes less than 2 seconds.     Findings: No bruising or erythema.  Neurological:     General: No focal deficit present.     Mental Status: He is alert and oriented to person, place, and time.      UC Treatments / Results  Labs (all labs ordered are listed, but only abnormal results are displayed) Labs Reviewed - No data to display  EKG   Radiology DG Ankle Complete Right Result Date: 04/21/2024 CLINICAL DATA:  Right ankle pain and swelling 2 months. Ankle injury 2 months ago. EXAM: RIGHT ANKLE - COMPLETE 3+ VIEW COMPARISON:  03/08/2020 FINDINGS: Ankle mortise is normal. No acute fracture or dislocation. Degenerative change over the tibiotalar joint. Moderate size inferior calcaneal spur. Mild soft tissue swelling over the lateral ankle. IMPRESSION: 1. No acute findings. 2. Degenerative changes as described. Electronically Signed   By: Toribio Agreste M.D.   On: 04/21/2024 11:40    Procedures Procedures (including critical care time)  Medications Ordered in UC Medications - No data to display  Initial Impression / Assessment and Plan / UC Course  I have reviewed the triage vital signs and the nursing notes.  Pertinent labs & imaging results that were available during my care of the patient were reviewed by me and considered in my medical decision making (see chart for details).   Patient is a nontoxic-appearing 68 year old male presenting for evaluation of right ankle pain as outlined in the HPI above.  He states that I know when it is fractured and I need an x-ray.   He reports that he has had multiple surgeries on that ankle.  He reports that 2 months ago he knelt down and heard and felt a pop in his ankle  and has been having pain and swelling ever since.  As you can see in the image above, there is some edema to the anterior lateral ankle joint with tenderness to palpation.  Potential resolving ecchymosis.  DP and PT pulses are 2+.  No pain with palpation of the medial or lateral malleolus, proximal midfoot, calcaneus, or Achilles tendon.  DP and PT pulses are 2+.  He reports that he has been able to bear weight and ambulate though with pain.  I will obtain a radiograph to evaluate for any bony abnormality.  Right ankle x-rays independent reviewed and evaluated by me.  Impression: Degenerative changes noted throughout the ankle mortise joint.  No acute fracture or dislocation noted.  Soft tissue swelling is present over the anterior lateral aspect of the ankle.  In the lateral view there is a calcification anterior to the tibia that may represent calcified ligament or tendon.  Patient also has a large plantar calcaneal heel spur.  Radiology overread is pending. Radiology impression states degenerative changes over the tibiotalar joint.  Moderate size inferior calcaneal spur.  Mild soft tissue swelling over lateral ankle.  No acute findings.  I will discharge patient home with a diagnosis of acute on chronic right ankle pain.  I will start him on meloxicam  15 mg once daily to help with pain and inflammation.  CMP from 12/01/2023 shows normal renal function with a BUN of 10 and a creatinine of 0.9.  GFR was 95 at that time.   Final Clinical Impressions(s) / UC Diagnoses   Final diagnoses:  Chronic pain of right ankle     Discharge Instructions      Your x-rays do not show any evidence of broken or dislocated bones.  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  Take the  meloxicam  once daily to help with pain and inflammation.  Keep your right foot elevated is much as possible to help with pain and inflammation.  If your pain persists I would recommend you follow-up with orthopedics as you may need a targeted joint injection or you may need dedicated physical therapy to help your symptoms.     ED Prescriptions     Medication Sig Dispense Auth. Provider   meloxicam  (MOBIC ) 15 MG tablet Take 1 tablet (15 mg total) by mouth daily. 30 tablet Bernardino Ditch, NP      PDMP not reviewed this encounter.   Bernardino Ditch, NP 04/21/24 1157

## 2024-04-21 NOTE — ED Triage Notes (Signed)
 Patient states that he popped his right ankle about 2 months ago. Patient states that the ankle is swollen and in pain. Patient has hx of fracture and had to have surgeries due to injury.

## 2024-05-12 ENCOUNTER — Ambulatory Visit
Admission: EM | Admit: 2024-05-12 | Discharge: 2024-05-12 | Disposition: A | Discharge status: Home / Self Care | Attending: Physician Assistant | Admitting: Physician Assistant

## 2024-05-12 DIAGNOSIS — J029 Acute pharyngitis, unspecified: Secondary | ICD-10-CM

## 2024-05-12 DIAGNOSIS — J111 Influenza due to unidentified influenza virus with other respiratory manifestations: Secondary | ICD-10-CM | POA: Diagnosis not present

## 2024-05-12 DIAGNOSIS — Z20828 Contact with and (suspected) exposure to other viral communicable diseases: Secondary | ICD-10-CM | POA: Diagnosis not present

## 2024-05-12 DIAGNOSIS — R5383 Other fatigue: Secondary | ICD-10-CM

## 2024-05-12 DIAGNOSIS — R051 Acute cough: Secondary | ICD-10-CM

## 2024-05-12 LAB — POCT INFLUENZA A/B
Influenza A, POC: NEGATIVE
Influenza B, POC: NEGATIVE

## 2024-05-12 LAB — POCT RAPID STREP A (OFFICE): Rapid Strep A Screen: NEGATIVE

## 2024-05-12 LAB — POC SOFIA SARS ANTIGEN FIA: SARS Coronavirus 2 Ag: NEGATIVE

## 2024-05-12 MED ORDER — OSELTAMIVIR PHOSPHATE 75 MG PO CAPS: 75.0000 mg | ORAL_CAPSULE | Freq: Two times a day (BID) | ORAL | 0 refills | Status: AC

## 2024-05-12 MED ORDER — PROMETHAZINE-DM 6.25-15 MG/5ML PO SYRP: 5.0000 mL | ORAL_SOLUTION | Freq: Four times a day (QID) | ORAL | 0 refills | Status: AC | PRN

## 2024-05-12 NOTE — Discharge Instructions (Signed)
-  Flu negative but given exposure this could be a false negative. - You are within the window for treatment Tamiflu  to potentially be helpful so I sent it to the pharmacy if you are positive. - Sent cough medicine and Tamiflu . - You need to isolate until you are fever free for 24 hours and symptoms are improving. - Increase rest and fluids. - You should be seen again if you have uncontrolled fever, weakness or worsening breathing problem.

## 2024-05-12 NOTE — ED Triage Notes (Signed)
 Xzavior reports scratchy throat and cough x yesterday. I brought my boy over here yesterday and he have the flu, so I know I got it. No treatment used.

## 2024-05-12 NOTE — ED Provider Notes (Signed)
 " [facility]    CSN: [id number] Arrival date & time: 05/12/24  9160      History   Chief Complaint No chief complaint on file.   HPI Gregory Haley is a 68 y.o. male presenting for sore throat, mild cough and congestion as well as fatigue and slight bodyaches since yesterday.  Denies fever, chest pain, shortness of breath, vomiting or diarrhea.  Has been exposed to the flu.  Not taking any OTC meds.  Patient says he does not believe he has COVID because he has had that before and this does not feel like it.  He thinks he may only need a antibiotic injection for the flu.  No other complaints.  HPI  Past Medical History:  Diagnosis Date   Cancer [facility])    prostate   Diabetes mellitus without complication [facility])    ED (erectile dysfunction)    History of renal cell cancer    Hyperlipidemia    Prostate cancer (HCC)    Sleep apnea     There are no active problems to display for this patient.   Past Surgical History:  Procedure Laterality Date   ANKLE SURGERY Right    x 7   APPENDECTOMY     BACK SURGERY     COLONOSCOPY     COLONOSCOPY N/A 07/07/2023   Procedure: COLONOSCOPY;  Surgeon: Maryruth Ole DASEN, MD;  Location: [facility] ENDOSCOPY;  Service: Endoscopy;  Laterality: N/A;   COLONOSCOPY N/A 01/12/2024   Procedure: COLONOSCOPY;  Surgeon: Maryruth Ole DASEN, MD;  Location: [facility] ENDOSCOPY;  Service: Endoscopy;  Laterality: N/A;   EYE SURGERY     HERNIA REPAIR     INNER EAR SURGERY     KIDNEY SURGERY     NEPHRECTOMY Right    POLYPECTOMY  07/07/2023   Procedure: POLYPECTOMY;  Surgeon: Maryruth Ole DASEN, MD;  Location: [facility] ENDOSCOPY;  Service: Endoscopy;;   POLYPECTOMY  01/12/2024   Procedure: POLYPECTOMY, INTESTINE;  Surgeon: Maryruth Ole DASEN, MD;  Location: [facility] ENDOSCOPY;  Service: Endoscopy;;   PROSTATE SURGERY     TONSILLECTOMY     VASECTOMY         Home Medications    Prior to Admission medications  Medication Sig Start Date End Date Taking?  Authorizing Provider  oseltamivir  (TAMIFLU ) 75 MG capsule Take 1 capsule (75 mg total) by mouth every 12 (twelve) hours for 5 days. 05/12/24 05/17/24 Yes Arvis Huxley B, PA-C  promethazine -dextromethorphan (PROMETHAZINE -DM) 6.25-15 MG/5ML syrup Take 5 mLs by mouth 4 (four) times daily as needed. 05/12/24  Yes Arvis Huxley NOVAK, PA-C  albuterol  (VENTOLIN  HFA) 108 (90 Base) MCG/ACT inhaler Inhale 1-2 puffs into the lungs every 6 (six) hours as needed for wheezing or shortness of breath. 04/24/23   Arvis Huxley NOVAK, PA-C  meloxicam  (MOBIC ) 15 MG tablet Take 1 tablet (15 mg total) by mouth daily. 04/21/24   Bernardino Ditch, NP  metFORMIN (GLUCOPHAGE-XR) 500 MG 24 hr tablet Take by mouth. 02/28/22 01/12/24  [provider]  Multiple Vitamin (MULTI-VITAMIN) tablet Take 1 tablet by mouth daily.    [provider]  rosuvastatin (CRESTOR) 40 MG tablet Take by mouth. 03/10/22 01/12/24  [provider]    Family History No family history on file.  Social History Social History   Tobacco Use   Smoking status: Former   Smokeless tobacco: Never  Advertising Account Planner   Vaping status: Never Used  Substance Use Topics   Alcohol use: Yes    Comment: occ  Drug use: No     Allergies   Bee venom, Microplegia msa-msg [cardioplegia del nido formula], Monosodium glutamate, and Plegisol   Review of Systems Review of Systems  Constitutional:  Positive for fatigue. Negative for fever.  HENT:  Positive for congestion, rhinorrhea and sore throat. Negative for sinus pressure and sinus pain.   Respiratory:  Positive for cough. Negative for shortness of breath.   Cardiovascular:  Negative for chest pain.  Gastrointestinal:  Negative for abdominal pain, diarrhea, nausea and vomiting.  Musculoskeletal:  Positive for myalgias.  Neurological:  Negative for weakness, light-headedness and headaches.  Hematological:  Negative for adenopathy.     Physical Exam Triage Vital Signs  No data  found.  Updated Vital Signs BP 134/79 (BP Location: Left Arm)   Pulse 80   Temp 97.8 F (36.6 C) (Oral)   Resp 20   Ht 5' 11 (1.803 m)   Wt 270 lb (122.5 kg)   SpO2 96%   BMI 37.66 kg/m    Physical Exam Vitals and nursing note reviewed.  Constitutional:      General: He is not in acute distress.    Appearance: Normal appearance. He is well-developed. He is not ill-appearing.  HENT:     Head: Normocephalic and atraumatic.     Right Ear: Tympanic membrane, ear canal and external ear normal.     Left Ear: Tympanic membrane, ear canal and external ear normal.     Nose: Congestion present.     Mouth/Throat:     Mouth: Mucous membranes are moist.     Pharynx: Oropharynx is clear. Posterior oropharyngeal erythema present.  Eyes:     General: No scleral icterus.    Conjunctiva/sclera: Conjunctivae normal.  Cardiovascular:     Rate and Rhythm: Normal rate and regular rhythm.     Heart sounds: Normal heart sounds.  Pulmonary:     Effort: Pulmonary effort is normal. No respiratory distress.     Breath sounds: Normal breath sounds.  Musculoskeletal:     Cervical back: Neck supple.  Skin:    General: Skin is warm and dry.     Capillary Refill: Capillary refill takes less than 2 seconds.  Neurological:     General: No focal deficit present.     Mental Status: He is alert. Mental status is at baseline.     Motor: No weakness.     Gait: Gait normal.  Psychiatric:        Mood and Affect: Mood normal.        Behavior: Behavior normal.      UC Treatments / Results  Labs (all labs ordered are listed, but only abnormal results are displayed) Labs Reviewed  POC SOFIA SARS ANTIGEN FIA - Normal  POCT INFLUENZA A/B - Normal  POCT RAPID STREP A (OFFICE) - Normal    EKG   Radiology No results found.  Procedures Procedures (including critical care time)  Medications Ordered in UC Medications - No data to display  Initial Impression / Assessment and Plan / UC Course  I  have reviewed the triage vital signs and the nursing notes.  Pertinent labs & imaging results that were available during my care of the patient were reviewed by me and considered in my medical decision making (see chart for details).   68 y/o male presents for sore throat, fatigue, cough, congestion and aches since yesterday.  Has been exposed to influenza.  Patient is afebrile and overall well-appearing.  On exam a slight congestion,  mild posterior pharyngeal erythema.  Chest clear.  Rapid strep test performed.  Negative. Rapid flu and COVID testing obtained. Negative.   Suspect false negative on the flu test since his son was positive here yesterday.  Will treat for flu with Tamiflu . Supportive care encouraged with increasing rest and fluids.  Sent Promethazine  DM to pharmacy.  Also advised Tylenol , throat lozenges, Chloraseptic spray.  Explained that symptoms should get better in the next 1 to 2 weeks.  Reviewed return precautions.  Acute illness with systemic symptoms.    Final Clinical Impressions(s) / UC Diagnoses   Final diagnoses:  Sore throat  Influenza-like illness  Acute cough  Exposure to influenza     Discharge Instructions      -Flu negative but given exposure this could be a false negative. - You are within the window for treatment Tamiflu  to potentially be helpful so I sent it to the pharmacy if you are positive. - Sent cough medicine and Tamiflu . - You need to isolate until you are fever free for 24 hours and symptoms are improving. - Increase rest and fluids. - You should be seen again if you have uncontrolled fever, weakness or worsening breathing problem.       ED Prescriptions     Medication Sig Dispense Auth. Provider   oseltamivir  (TAMIFLU ) 75 MG capsule Take 1 capsule (75 mg total) by mouth every 12 (twelve) hours for 5 days. 10 capsule Arvis Huxley B, PA-C   promethazine -dextromethorphan (PROMETHAZINE -DM) 6.25-15 MG/5ML syrup Take 5 mLs by mouth  4 (four) times daily as needed. 118 mL Arvis Huxley NOVAK, PA-C      PDMP not reviewed this encounter.       Arvis Huxley NOVAK, PA-C 05/12/24 1001  "
